# Patient Record
Sex: Male | Born: 1955 | Race: Black or African American | Hispanic: No | Smoking: Former smoker
Health system: Southern US, Community
[De-identification: ages and names within clinical notes are randomized; demographics above are authoritative.]

## PROBLEM LIST (undated history)

## (undated) DIAGNOSIS — K649 Unspecified hemorrhoids: Secondary | ICD-10-CM

## (undated) DIAGNOSIS — M7500 Adhesive capsulitis of unspecified shoulder: Secondary | ICD-10-CM

## (undated) DIAGNOSIS — G43909 Migraine, unspecified, not intractable, without status migrainosus: Secondary | ICD-10-CM

## (undated) DIAGNOSIS — D126 Benign neoplasm of colon, unspecified: Secondary | ICD-10-CM

## (undated) DIAGNOSIS — E782 Mixed hyperlipidemia: Secondary | ICD-10-CM

## (undated) DIAGNOSIS — M6580 Other synovitis and tenosynovitis, unspecified site: Secondary | ICD-10-CM

## (undated) DIAGNOSIS — E041 Nontoxic single thyroid nodule: Secondary | ICD-10-CM

## (undated) DIAGNOSIS — K602 Anal fissure, unspecified: Secondary | ICD-10-CM

## (undated) DIAGNOSIS — R7989 Other specified abnormal findings of blood chemistry: Secondary | ICD-10-CM

## (undated) DIAGNOSIS — R945 Abnormal results of liver function studies: Secondary | ICD-10-CM

## (undated) DIAGNOSIS — I10 Essential (primary) hypertension: Secondary | ICD-10-CM

## (undated) HISTORY — DX: Benign neoplasm of colon, unspecified: D12.6

## (undated) HISTORY — DX: Migraine, unspecified, not intractable, without status migrainosus: G43.909

## (undated) HISTORY — DX: Anal fissure, unspecified: K60.2

## (undated) HISTORY — PX: HEMICOLECTOMY: SHX854

## (undated) HISTORY — DX: Adhesive capsulitis of unspecified shoulder: M75.00

## (undated) HISTORY — DX: Other synovitis and tenosynovitis, unspecified site: M65.80

## (undated) HISTORY — DX: Unspecified hemorrhoids: K64.9

## (undated) HISTORY — DX: Essential (primary) hypertension: I10

## (undated) HISTORY — DX: Nontoxic single thyroid nodule: E04.1

## (undated) HISTORY — DX: Other specified abnormal findings of blood chemistry: R79.89

## (undated) HISTORY — DX: Mixed hyperlipidemia: E78.2

## (undated) HISTORY — DX: Abnormal results of liver function studies: R94.5

## (undated) HISTORY — PX: OTHER SURGICAL HISTORY: SHX169

---

## 1998-01-29 ENCOUNTER — Encounter (HOSPITAL_COMMUNITY): Admission: RE | Admit: 1998-01-29 | Discharge: 1998-04-29 | Payer: Self-pay | Admitting: Psychiatry

## 1999-02-05 ENCOUNTER — Ambulatory Visit (HOSPITAL_BASED_OUTPATIENT_CLINIC_OR_DEPARTMENT_OTHER): Admission: RE | Admit: 1999-02-05 | Discharge: 1999-02-05 | Payer: Self-pay | Admitting: Surgery

## 2010-07-01 DIAGNOSIS — D126 Benign neoplasm of colon, unspecified: Secondary | ICD-10-CM

## 2010-07-01 HISTORY — DX: Benign neoplasm of colon, unspecified: D12.6

## 2010-07-30 ENCOUNTER — Inpatient Hospital Stay (HOSPITAL_COMMUNITY): Admission: EM | Admit: 2010-07-30 | Discharge: 2010-08-10 | Payer: Self-pay | Admitting: Emergency Medicine

## 2010-07-30 ENCOUNTER — Encounter: Admission: RE | Admit: 2010-07-30 | Discharge: 2010-07-30 | Payer: Self-pay | Admitting: Gastroenterology

## 2010-07-30 ENCOUNTER — Encounter (INDEPENDENT_AMBULATORY_CARE_PROVIDER_SITE_OTHER): Payer: Self-pay

## 2011-01-13 LAB — CULTURE, BLOOD (ROUTINE X 2)
Culture  Setup Time: 201110032128
Culture: NO GROWTH

## 2011-01-13 LAB — BASIC METABOLIC PANEL
BUN: 12 mg/dL (ref 6–23)
BUN: 12 mg/dL (ref 6–23)
BUN: 15 mg/dL (ref 6–23)
BUN: 4 mg/dL — ABNORMAL LOW (ref 6–23)
BUN: 5 mg/dL — ABNORMAL LOW (ref 6–23)
CO2: 26 mEq/L (ref 19–32)
CO2: 26 mEq/L (ref 19–32)
CO2: 27 mEq/L (ref 19–32)
CO2: 28 mEq/L (ref 19–32)
CO2: 28 mEq/L (ref 19–32)
CO2: 28 mEq/L (ref 19–32)
CO2: 30 mEq/L (ref 19–32)
Calcium: 8.3 mg/dL — ABNORMAL LOW (ref 8.4–10.5)
Calcium: 8.3 mg/dL — ABNORMAL LOW (ref 8.4–10.5)
Calcium: 8.4 mg/dL (ref 8.4–10.5)
Calcium: 8.4 mg/dL (ref 8.4–10.5)
Calcium: 8.6 mg/dL (ref 8.4–10.5)
Calcium: 8.8 mg/dL (ref 8.4–10.5)
Chloride: 102 mEq/L (ref 96–112)
Chloride: 106 mEq/L (ref 96–112)
Chloride: 108 mEq/L (ref 96–112)
Chloride: 99 mEq/L (ref 96–112)
Creatinine, Ser: 1.09 mg/dL (ref 0.4–1.5)
Creatinine, Ser: 1.34 mg/dL (ref 0.4–1.5)
Creatinine, Ser: 1.4 mg/dL (ref 0.4–1.5)
Creatinine, Ser: 1.4 mg/dL (ref 0.4–1.5)
Creatinine, Ser: 1.48 mg/dL (ref 0.4–1.5)
GFR calc Af Amer: >60 mL/min (ref >60)
GFR calc Af Amer: >60 mL/min (ref >60)
GFR calc non Af Amer: 50 mL/min — ABNORMAL LOW (ref >60)
GFR calc non Af Amer: 53 mL/min — ABNORMAL LOW (ref >60)
GFR calc non Af Amer: 59 mL/min — ABNORMAL LOW (ref >60)
Glucose, Bld: 107 mg/dL — ABNORMAL HIGH (ref 70–99)
Glucose, Bld: 107 mg/dL — ABNORMAL HIGH (ref 70–99)
Glucose, Bld: 109 mg/dL — ABNORMAL HIGH (ref 70–99)
Glucose, Bld: 112 mg/dL — ABNORMAL HIGH (ref 70–99)
Glucose, Bld: 114 mg/dL — ABNORMAL HIGH (ref 70–99)
Glucose, Bld: 114 mg/dL — ABNORMAL HIGH (ref 70–99)
Glucose, Bld: 124 mg/dL — ABNORMAL HIGH (ref 70–99)
Potassium: 3.8 mEq/L (ref 3.5–5.1)
Potassium: 4.1 mEq/L (ref 3.5–5.1)
Potassium: 4.2 mEq/L (ref 3.5–5.1)
Potassium: 4.2 mEq/L (ref 3.5–5.1)
Sodium: 136 mEq/L (ref 135–145)
Sodium: 137 mEq/L (ref 135–145)
Sodium: 138 mEq/L (ref 135–145)

## 2011-01-13 LAB — WOUND CULTURE

## 2011-01-13 LAB — CBC
HCT: 32.6 % — ABNORMAL LOW (ref 39.0–52.0)
HCT: 33.1 % — ABNORMAL LOW (ref 39.0–52.0)
HCT: 33.8 % — ABNORMAL LOW (ref 39.0–52.0)
HCT: 34.8 % — ABNORMAL LOW (ref 39.0–52.0)
HCT: 38.4 % — ABNORMAL LOW (ref 39.0–52.0)
HCT: 41.6 % (ref 39.0–52.0)
Hemoglobin: 10.8 g/dL — ABNORMAL LOW (ref 13.0–17.0)
Hemoglobin: 11.2 g/dL — ABNORMAL LOW (ref 13.0–17.0)
Hemoglobin: 11.4 g/dL — ABNORMAL LOW (ref 13.0–17.0)
MCH: 30.4 pg (ref 26.0–34.0)
MCH: 30.7 pg (ref 26.0–34.0)
MCH: 30.7 pg (ref 26.0–34.0)
MCH: 30.8 pg (ref 26.0–34.0)
MCH: 31 pg (ref 26.0–34.0)
MCH: 31 pg (ref 26.0–34.0)
MCH: 31.2 pg (ref 26.0–34.0)
MCHC: 32.8 g/dL (ref 30.0–36.0)
MCHC: 33.1 g/dL (ref 30.0–36.0)
MCHC: 33.1 g/dL (ref 30.0–36.0)
MCHC: 33.3 g/dL (ref 30.0–36.0)
MCHC: 33.5 g/dL (ref 30.0–36.0)
MCHC: 33.8 g/dL (ref 30.0–36.0)
MCHC: 35.8 g/dL (ref 30.0–36.0)
MCV: 91.8 fL (ref 78.0–100.0)
MCV: 92 fL (ref 78.0–100.0)
MCV: 92.6 fL (ref 78.0–100.0)
MCV: 93 fL (ref 78.0–100.0)
Platelets: 202 10*3/uL (ref 150–400)
Platelets: 209 10*3/uL (ref 150–400)
Platelets: 231 10*3/uL (ref 150–400)
Platelets: 558 10*3/uL — ABNORMAL HIGH (ref 150–400)
Platelets: 209 10*3/uL (ref 150–400)
RBC: 3.62 MIL/uL — ABNORMAL LOW (ref 4.22–5.81)
RBC: 3.68 MIL/uL — ABNORMAL LOW (ref 4.22–5.81)
RBC: 3.7 MIL/uL — ABNORMAL LOW (ref 4.22–5.81)
RBC: 4.13 MIL/uL — ABNORMAL LOW (ref 4.22–5.81)
RDW: 14.5 % (ref 11.5–15.5)
RDW: 14.6 % (ref 11.5–15.5)
RDW: 14.7 % (ref 11.5–15.5)
RDW: 14.9 % (ref 11.5–15.5)
RDW: 15 % (ref 11.5–15.5)
RDW: 14.7 % (ref 11.5–15.5)
WBC: 9.3 10*3/uL (ref 4.0–10.5)
WBC: 11.9 10*3/uL — ABNORMAL HIGH (ref 4.0–10.5)

## 2011-01-13 LAB — MAGNESIUM: Magnesium: 1.9 mg/dL (ref 1.5–2.5)

## 2011-01-13 LAB — COMPREHENSIVE METABOLIC PANEL
ALT: 35 U/L (ref 0–53)
Albumin: 3.9 g/dL (ref 3.5–5.2)
Alkaline Phosphatase: 56 U/L (ref 39–117)
BUN: 11 mg/dL (ref 6–23)
Calcium: 9.3 mg/dL (ref 8.4–10.5)
Glucose, Bld: 95 mg/dL (ref 70–99)
Potassium: 3.3 mEq/L — ABNORMAL LOW (ref 3.5–5.1)
Sodium: 138 mEq/L (ref 135–145)
Total Protein: 7 g/dL (ref 6.0–8.3)

## 2011-01-13 LAB — PROTIME-INR
INR: 1.13 (ref 0.00–1.49)
Prothrombin Time: 14.7 seconds (ref 11.6–15.2)

## 2011-01-13 LAB — MRSA PCR SCREENING: MRSA by PCR: NEGATIVE

## 2011-01-13 LAB — PHOSPHORUS
Phosphorus: 1.5 mg/dL — ABNORMAL LOW (ref 2.3–4.6)
Phosphorus: 3.1 mg/dL (ref 2.3–4.6)

## 2011-02-24 ENCOUNTER — Other Ambulatory Visit: Payer: Self-pay | Admitting: General Surgery

## 2011-02-25 ENCOUNTER — Ambulatory Visit
Admission: RE | Admit: 2011-02-25 | Discharge: 2011-02-25 | Disposition: A | Discharge status: Home / Self Care | Payer: 59 | Source: Ambulatory Visit | Attending: General Surgery | Admitting: General Surgery

## 2011-02-25 MED ORDER — IOHEXOL 300 MG/ML  SOLN
100.0000 mL | Freq: Once | INTRAMUSCULAR | Status: AC | PRN
  Administered 2011-02-25: 100 mL via INTRAVENOUS

## 2011-02-28 ENCOUNTER — Other Ambulatory Visit: Payer: Self-pay

## 2013-12-20 ENCOUNTER — Other Ambulatory Visit: Payer: Self-pay | Admitting: Family Medicine

## 2013-12-20 DIAGNOSIS — R269 Unspecified abnormalities of gait and mobility: Secondary | ICD-10-CM

## 2013-12-20 DIAGNOSIS — R531 Weakness: Secondary | ICD-10-CM

## 2013-12-21 ENCOUNTER — Ambulatory Visit
Admission: RE | Admit: 2013-12-21 | Discharge: 2013-12-21 | Disposition: A | Discharge status: Home / Self Care | Payer: 59 | Source: Ambulatory Visit | Attending: Family Medicine | Admitting: Family Medicine

## 2013-12-21 DIAGNOSIS — R531 Weakness: Secondary | ICD-10-CM

## 2013-12-21 DIAGNOSIS — R269 Unspecified abnormalities of gait and mobility: Secondary | ICD-10-CM

## 2014-01-01 ENCOUNTER — Other Ambulatory Visit: Payer: Self-pay | Admitting: Family Medicine

## 2014-01-01 DIAGNOSIS — G459 Transient cerebral ischemic attack, unspecified: Secondary | ICD-10-CM

## 2014-01-03 ENCOUNTER — Other Ambulatory Visit: Payer: 59

## 2014-01-06 ENCOUNTER — Ambulatory Visit
Admission: RE | Admit: 2014-01-06 | Discharge: 2014-01-06 | Disposition: A | Discharge status: Home / Self Care | Payer: 59 | Source: Ambulatory Visit | Attending: Family Medicine | Admitting: Family Medicine

## 2014-01-06 DIAGNOSIS — G459 Transient cerebral ischemic attack, unspecified: Secondary | ICD-10-CM

## 2014-01-21 ENCOUNTER — Ambulatory Visit: Payer: 59 | Admitting: Neurology

## 2014-02-11 ENCOUNTER — Ambulatory Visit: Payer: 59 | Admitting: Neurology

## 2014-05-22 ENCOUNTER — Encounter: Payer: Self-pay | Admitting: Cardiology

## 2014-05-22 DIAGNOSIS — E782 Mixed hyperlipidemia: Secondary | ICD-10-CM | POA: Insufficient documentation

## 2014-05-22 DIAGNOSIS — E041 Nontoxic single thyroid nodule: Secondary | ICD-10-CM | POA: Insufficient documentation

## 2014-05-22 DIAGNOSIS — I10 Essential (primary) hypertension: Secondary | ICD-10-CM | POA: Insufficient documentation

## 2014-05-22 DIAGNOSIS — G43909 Migraine, unspecified, not intractable, without status migrainosus: Secondary | ICD-10-CM | POA: Insufficient documentation

## 2014-05-28 ENCOUNTER — Ambulatory Visit: Payer: 59 | Admitting: Interventional Cardiology

## 2014-10-03 DIAGNOSIS — Z0000 Encounter for general adult medical examination without abnormal findings: Secondary | ICD-10-CM | POA: Insufficient documentation

## 2015-02-24 ENCOUNTER — Encounter: Payer: Self-pay | Admitting: Internal Medicine

## 2015-03-26 ENCOUNTER — Encounter: Payer: Self-pay | Admitting: *Deleted

## 2015-04-29 ENCOUNTER — Ambulatory Visit (INDEPENDENT_AMBULATORY_CARE_PROVIDER_SITE_OTHER): Payer: BLUE CROSS/BLUE SHIELD | Admitting: Internal Medicine

## 2015-04-29 ENCOUNTER — Other Ambulatory Visit (INDEPENDENT_AMBULATORY_CARE_PROVIDER_SITE_OTHER): Payer: BLUE CROSS/BLUE SHIELD

## 2015-04-29 ENCOUNTER — Encounter: Payer: Self-pay | Admitting: Internal Medicine

## 2015-04-29 VITALS — BP 130/86 | HR 72 | Ht 66.14 in | Wt 230.2 lb

## 2015-04-29 DIAGNOSIS — Z860101 Personal history of adenomatous and serrated colon polyps: Secondary | ICD-10-CM

## 2015-04-29 DIAGNOSIS — R945 Abnormal results of liver function studies: Principal | ICD-10-CM

## 2015-04-29 DIAGNOSIS — Z86010 Personal history of colonic polyps: Secondary | ICD-10-CM

## 2015-04-29 DIAGNOSIS — R7989 Other specified abnormal findings of blood chemistry: Secondary | ICD-10-CM | POA: Diagnosis not present

## 2015-04-29 LAB — HEPATIC FUNCTION PANEL
ALBUMIN: 4.3 g/dL (ref 3.5–5.2)
ALT: 163 U/L — AB (ref 0–53)
AST: 85 U/L — AB (ref 0–37)
Alkaline Phosphatase: 60 U/L (ref 39–117)
Bilirubin, Direct: 0.2 mg/dL (ref 0.0–0.3)
Total Bilirubin: 0.7 mg/dL (ref 0.2–1.2)
Total Protein: 7.4 g/dL (ref 6.0–8.3)

## 2015-04-29 LAB — CBC WITH DIFFERENTIAL/PLATELET
Basophils Absolute: 0 10*3/uL (ref 0.0–0.1)
Basophils Relative: 0.5 % (ref 0.0–3.0)
EOS ABS: 0.2 10*3/uL (ref 0.0–0.7)
Eosinophils Relative: 3.3 % (ref 0.0–5.0)
HEMATOCRIT: 43.8 % (ref 39.0–52.0)
HEMOGLOBIN: 14.9 g/dL (ref 13.0–17.0)
LYMPHS PCT: 34 % (ref 12.0–46.0)
Lymphs Abs: 1.6 10*3/uL (ref 0.7–4.0)
MCHC: 34 g/dL (ref 30.0–36.0)
MCV: 91.3 fl (ref 78.0–100.0)
Monocytes Absolute: 0.7 10*3/uL (ref 0.1–1.0)
Monocytes Relative: 14.8 % — ABNORMAL HIGH (ref 3.0–12.0)
NEUTROS PCT: 47.4 % (ref 43.0–77.0)
Neutro Abs: 2.2 10*3/uL (ref 1.4–7.7)
Platelets: 193 10*3/uL (ref 150.0–400.0)
RBC: 4.79 Mil/uL (ref 4.22–5.81)
RDW: 14.1 % (ref 11.5–15.5)
WBC: 4.6 10*3/uL (ref 4.0–10.5)

## 2015-04-29 LAB — PROTIME-INR
INR: 1 ratio (ref 0.8–1.0)
Prothrombin Time: 10.7 s (ref 9.6–13.1)

## 2015-04-29 NOTE — Patient Instructions (Signed)
Your physician has requested that you go to the basement for lab work before leaving today.  We will contact you once we get additional medical records.

## 2015-04-30 ENCOUNTER — Encounter: Payer: Self-pay | Admitting: Internal Medicine

## 2015-04-30 LAB — IGG: IgG (Immunoglobin G), Serum: 1340 mg/dL (ref 650–1600)

## 2015-04-30 LAB — ANA: ANA: NEGATIVE

## 2015-04-30 NOTE — Progress Notes (Signed)
Patient ID: William Weaver, male   DOB: 02-15-1956, 59 y.o.   MRN: [id number] HPI: William Weaver is 59 yo male with PMH of adenomatous colon polyps at colonoscopy in 9622 complicated by right colon perforation requiring right hemicolectomy, hypertension, hyperlipidemia who is seen in consultation at the request of Dr. Moreen Fowler to evaluate elevated liver enzymes.  He is here alone today. He reports he is feeling well. He's had elevated liver enzymes over the last approximate year. Denies abdominal symptoms entirely. Reports he feels well with good energy. He works out 2-4 days a week lifting weights and doing cardio usually on an elliptical machine. He denies abdominal pain. No history of ascites or lower extremity edema. No history of jaundice. No itching. No nausea, vomiting, early satiety, dysphagia or odynophagia. Good appetite. No heartburn. No weight loss. No change in bowel habits including no rectal bleeding or melena. He does have a history of hemorrhoids. He also has a history of migraine headaches and was started on Depakote roughly 1 year ago. He takes amlodipine, clonidine, labetalol, and valsartan for hypertension.  He does drink alcohol approximately one glass 1 per day and occasional liquor on the weekends usually no more than 2 drinks in any day.  He is nervous about any further colonoscopy given perforation after polypectomy at initial screening exam. He reports he feels like this procedure was "botched"  Family history notable for heart disease. No family history of GI malignancy or liver disease.  Past Medical History  Diagnosis Date  . Essential hypertension, benign   . Mixed hyperlipidemia   . Nontoxic uninodular goiter   . Migraine, unspecified, without mention of intractable migraine without mention of status migrainosus     Dr. Ginny Forth [facility]  . Hemorrhoid     fistula  . Calcium deposits in tendon     KNEE  . Frozen shoulder   . Elevated LFTs   . Tubular adenoma of colon  07/2010    MULTIPLE  . Anal fissure     Past Surgical History  Procedure Laterality Date  . Diagnostic lapaoscopy      hernia repair  . Hemicolectomy      with ileocolonic anastomosis    Outpatient Prescriptions Prior to Visit  Medication Sig Dispense Refill  . amLODipine (NORVASC) 10 MG tablet Take 10 mg by mouth daily.    Marland Kitchen aspirin EC 81 MG tablet Take 243 mg by mouth daily.     . cloNIDine (CATAPRES) 0.2 MG tablet Take 0.2 mg by mouth 2 (two) times daily.     . Multiple Vitamins tablet Take 1 tablet by mouth daily.     . valsartan (DIOVAN) 320 MG tablet Take 320 mg by mouth daily.    . vardenafil (LEVITRA) 20 MG tablet Take 20 mg by mouth daily as needed for erectile dysfunction.    . carvedilol (COREG) 6.25 MG tablet Take 6.25 mg by mouth 2 (two) times daily with a meal. Only takes when goes to gym    . acetaminophen (TYLENOL) 325 MG tablet Take 325 mg by mouth every 4 (four) hours as needed.     . nortriptyline (PAMELOR) 25 MG capsule Take 25 mg by mouth at bedtime.    . Omega-3 1000 MG CAPS Take 1 g by mouth daily.      No facility-administered medications prior to visit.    Allergies  Allergen Reactions  . Penicillin G     Other reaction(s): Tremor (intolerance)    Family History  Problem  Relation Age of Onset  . Hypotension Mother   . Hypertension Father   . Hypertension Sister   . Heart attack Maternal Grandmother   . Heart attack Maternal Grandfather   . Heart attack Paternal Grandmother   . Heart attack Paternal Grandfather     History  Substance Use Topics  . Smoking status: Former Smoker    Types: Cigarettes    Quit date: 10/31/2009  . Smokeless tobacco: Never Used  . Alcohol Use: 4.2 oz/week    7 Standard drinks or equivalent per week     Comment: glass of wine a day    ROS: As per history of present illness, otherwise negative  BP 130/86 mmHg  Pulse 72  Ht 5' 6.14" (1.68 m)  Wt 230 lb 4 oz (104.441 kg)  BMI 37.00 kg/m2 Constitutional:  Well-developed and well-nourished. No distress. HEENT: Normocephalic and atraumatic. Oropharynx is clear and moist. No oropharyngeal exudate. Conjunctivae are normal.  No scleral icterus. Neck: Neck supple. Trachea midline. Cardiovascular: Normal rate, regular rhythm and intact distal pulses. No M/R/G Pulmonary/chest: Effort normal and breath sounds normal. No wheezing, rales or rhonchi. Abdominal: Soft, nontender, nondistended. Bowel sounds active throughout. There are no masses palpable. No hepatosplenomegaly. Extremities: no clubbing, cyanosis, or edema Lymphadenopathy: No cervical adenopathy noted. Neurological: Alert and oriented to person place and time. Skin: Skin is warm and dry. No rashes noted. Psychiatric: Normal mood and affect. Behavior is normal.  RELEVANT LABS AND IMAGING: CBC    Component Value Date/Time   WBC 4.6 04/29/2015 1647   RBC 4.79 04/29/2015 1647   HGB 14.9 04/29/2015 1647   HCT 43.8 04/29/2015 1647   PLT 193.0 04/29/2015 1647   MCV 91.3 04/29/2015 1647   MCH 30.7 08/09/2010 0600   MCHC 34.0 04/29/2015 1647   RDW 14.1 04/29/2015 1647   LYMPHSABS 1.6 04/29/2015 1647   MONOABS 0.7 04/29/2015 1647   EOSABS 0.2 04/29/2015 1647   BASOSABS 0.0 04/29/2015 1647    CMP     Component Value Date/Time   NA 141 08/09/2010 0600   K 4.1 08/09/2010 0600   CL 108 08/09/2010 0600   CO2 27 08/09/2010 0600   GLUCOSE 114* 08/09/2010 0600   BUN 4* 08/09/2010 0600   CREATININE 1.09 08/09/2010 0600   CALCIUM 8.8 08/09/2010 0600   PROT 7.4 04/29/2015 1647   ALBUMIN 4.3 04/29/2015 1647   AST 85* 04/29/2015 1647   ALT 163* 04/29/2015 1647   ALKPHOS 60 04/29/2015 1647   BILITOT 0.7 04/29/2015 1647   GFRNONAA >60 08/09/2010 0600   GFRAA  08/09/2010 0600    >60        The eGFR has been calculated using the MDRD equation. This calculation has not been validated in all clinical situations. eGFR's persistently <60 mL/min signify possible Chronic Kidney Disease.    Viral hep panel - neg Iron sat 25%  Abdominal ultrasound performed by primary care recently -- reportedly normal. Formal report not yet obtained   Colonoscopy from 07/29/2010 -- Dr. Amedeo Plenty, report not available pathology report reveals cecal tubular adenoma; cecal polyp #2 tubular adenoma; transverse polyp tubular adenoma; descending polyp tubular adenoma; rectal polyp tubular adenoma --Surgical report from Dr. Greer Pickerel the next day for cecal perforation reviewed -- open right hemicolectomy with ileocolonic anastomosis  ASSESSMENT/PLAN:  59 yo male with PMH of adenomatous colon polyps at colonoscopy in 1610 complicated by right colon perforation requiring right hemicolectomy, hypertension, hyperlipidemia who is seen in consultation at the request of Dr.  Swayne to evaluate elevated liver enzymes.   1. Elevated LFTs -- elevation in serum AST and ALT, etiology unclear. I would like to review the ultrasound report, images of possible. I would like to exclude fatty liver disease as a possible source of transaminitis. If no steatosis seen by ultrasound than medication induced transaminitis is possible. Depakote is certainly possible and he has been on this medication about a year which is about as long as his enzymes have been elevated. I would like to perform other lab testing today to ensure no other causes of liver inflammation, specifically autoimmune conditions. Multiple liver labs checked today including ANA, IgG, AMA, ASMA, INR, alpha-1, ceruloplasmin. If other testing unrevealing, then will last the patient to stop Depakote and repeat enzymes in one month  2. History of adenomatous colon polyps -- after having perforation with first colonoscopy he is very concerned about the possibility of having another colonoscopy. Given multiple adenomatous polyps he is due for repeat screening at this time. We had a long discussion today regarding other screening modalities including barium enema and virtual  colonoscopy, but that if either of these tests are positive colonoscopy would be recommended. I let him know that I recommend he be okay with colonoscopy if we order any other screening tests. I would like to review the colonoscopy report from Dr. Amedeo Plenty and then we can further discuss how to repeat screening. He will also research and speak with his family about the other screening modalities we discussed.    Cc: Dr. Antony Contras, MD

## 2015-05-01 LAB — ANTI-SMOOTH MUSCLE ANTIBODY, IGG: Smooth Muscle Ab: 8 U (ref <20)

## 2015-05-01 LAB — ALPHA-1-ANTITRYPSIN: A1 ANTITRYPSIN SER: 118 mg/dL (ref 83–199)

## 2015-05-01 LAB — CERULOPLASMIN: CERULOPLASMIN: 22 mg/dL (ref 18–36)

## 2015-05-05 LAB — MITOCHONDRIAL ANTIBODIES: Mitochondrial M2 Ab, IgG: 1.19 — ABNORMAL HIGH (ref <0.91)

## 2015-05-06 ENCOUNTER — Other Ambulatory Visit: Payer: Self-pay

## 2015-05-06 DIAGNOSIS — R7989 Other specified abnormal findings of blood chemistry: Secondary | ICD-10-CM

## 2015-05-06 DIAGNOSIS — R945 Abnormal results of liver function studies: Principal | ICD-10-CM

## 2015-05-22 ENCOUNTER — Telehealth: Payer: Self-pay

## 2015-05-22 NOTE — Telephone Encounter (Signed)
Dr. Hilarie Fredrickson has reviewed previous Colonoscopy records and patient has had multiple adenomas, including high-risk lesions based on size. His recommendations are for patient to have a repeat Colonoscopy at this time.  Left a message for patient to return my call.

## 2015-05-24 ENCOUNTER — Emergency Department (HOSPITAL_COMMUNITY)
Admission: EM | Admit: 2015-05-24 | Discharge: 2015-05-24 | Disposition: A | Discharge status: Home / Self Care | Payer: BLUE CROSS/BLUE SHIELD | Attending: Emergency Medicine | Admitting: Emergency Medicine

## 2015-05-24 ENCOUNTER — Emergency Department (HOSPITAL_COMMUNITY): Payer: BLUE CROSS/BLUE SHIELD

## 2015-05-24 ENCOUNTER — Encounter (HOSPITAL_COMMUNITY): Payer: Self-pay | Admitting: Emergency Medicine

## 2015-05-24 DIAGNOSIS — G43909 Migraine, unspecified, not intractable, without status migrainosus: Secondary | ICD-10-CM | POA: Insufficient documentation

## 2015-05-24 DIAGNOSIS — Z880 Allergy status to penicillin: Secondary | ICD-10-CM | POA: Insufficient documentation

## 2015-05-24 DIAGNOSIS — S0512XA Contusion of eyeball and orbital tissues, left eye, initial encounter: Secondary | ICD-10-CM

## 2015-05-24 DIAGNOSIS — Z7982 Long term (current) use of aspirin: Secondary | ICD-10-CM | POA: Diagnosis not present

## 2015-05-24 DIAGNOSIS — W109XXA Fall (on) (from) unspecified stairs and steps, initial encounter: Secondary | ICD-10-CM | POA: Insufficient documentation

## 2015-05-24 DIAGNOSIS — K0381 Cracked tooth: Secondary | ICD-10-CM | POA: Insufficient documentation

## 2015-05-24 DIAGNOSIS — Z8639 Personal history of other endocrine, nutritional and metabolic disease: Secondary | ICD-10-CM | POA: Diagnosis not present

## 2015-05-24 DIAGNOSIS — I10 Essential (primary) hypertension: Secondary | ICD-10-CM | POA: Insufficient documentation

## 2015-05-24 DIAGNOSIS — Y9389 Activity, other specified: Secondary | ICD-10-CM | POA: Insufficient documentation

## 2015-05-24 DIAGNOSIS — Y929 Unspecified place or not applicable: Secondary | ICD-10-CM | POA: Diagnosis not present

## 2015-05-24 DIAGNOSIS — Z86018 Personal history of other benign neoplasm: Secondary | ICD-10-CM | POA: Insufficient documentation

## 2015-05-24 DIAGNOSIS — Y999 Unspecified external cause status: Secondary | ICD-10-CM | POA: Insufficient documentation

## 2015-05-24 DIAGNOSIS — Z87891 Personal history of nicotine dependence: Secondary | ICD-10-CM | POA: Insufficient documentation

## 2015-05-24 DIAGNOSIS — S01511A Laceration without foreign body of lip, initial encounter: Secondary | ICD-10-CM

## 2015-05-24 DIAGNOSIS — S0993XA Unspecified injury of face, initial encounter: Secondary | ICD-10-CM | POA: Diagnosis present

## 2015-05-24 DIAGNOSIS — Z79899 Other long term (current) drug therapy: Secondary | ICD-10-CM | POA: Insufficient documentation

## 2015-05-24 DIAGNOSIS — Z23 Encounter for immunization: Secondary | ICD-10-CM | POA: Diagnosis not present

## 2015-05-24 DIAGNOSIS — K088 Other specified disorders of teeth and supporting structures: Secondary | ICD-10-CM | POA: Diagnosis not present

## 2015-05-24 MED ORDER — LIDOCAINE HCL (PF) 1 % IJ SOLN
5.0000 mL | Freq: Once | INTRAMUSCULAR | Status: AC
  Administered 2015-05-24: 5 mL
  Filled 2015-05-24: qty 5

## 2015-05-24 MED ORDER — LIDOCAINE HCL (PF) 1 % IJ SOLN
INTRAMUSCULAR | Status: AC
  Filled 2015-05-24: qty 5

## 2015-05-24 MED ORDER — IBUPROFEN 400 MG PO TABS
600.0000 mg | ORAL_TABLET | Freq: Once | ORAL | Status: AC
  Administered 2015-05-24: 600 mg via ORAL
  Filled 2015-05-24 (×2): qty 1

## 2015-05-24 MED ORDER — LIDOCAINE HCL (PF) 1 % IJ SOLN: 5.0000 mL | Freq: Once | INTRAMUSCULAR | Status: DC

## 2015-05-24 MED ORDER — TETANUS-DIPHTH-ACELL PERTUSSIS 5-2.5-18.5 LF-MCG/0.5 IM SUSP
0.5000 mL | Freq: Once | INTRAMUSCULAR | Status: AC
  Administered 2015-05-24: 0.5 mL via INTRAMUSCULAR
  Filled 2015-05-24: qty 0.5

## 2015-05-24 MED ORDER — LIDOCAINE HCL (PF) 1 % IJ SOLN
5.0000 mL | Freq: Once | INTRAMUSCULAR | Status: AC
  Administered 2015-05-24: 5 mL via INTRADERMAL
  Filled 2015-05-24: qty 5

## 2015-05-24 NOTE — ED Notes (Signed)
MD at bedside. 

## 2015-05-24 NOTE — ED Provider Notes (Signed)
CSN: [id number]     Arrival date & time 05/24/15  1141 History   First MD Initiated Contact with Patient 05/24/15 1156     Chief Complaint  Patient presents with  . Fall  . Lip Laceration     (Consider location/radiation/quality/duration/timing/severity/associated sxs/prior Treatment) Patient is a 59 y.o. male presenting with skin laceration.  Laceration Location:  Head/neck Head/neck laceration location: chin, lower lip. Length (cm):  3 Laceration depth: through and through. Quality: jagged and stellate   Bleeding: controlled   Time since incident:  12 hours Laceration mechanism:  Fall Pain details:    Severity:  Mild   Progression:  Unchanged Tetanus status:  Out of date   Past Medical History  Diagnosis Date  . Essential hypertension, benign   . Mixed hyperlipidemia   . Nontoxic uninodular goiter   . Migraine, unspecified, without mention of intractable migraine without mention of status migrainosus     Dr. Ginny Forth [facility]  . Hemorrhoid     fistula  . Calcium deposits in tendon     KNEE  . Frozen shoulder   . Elevated LFTs   . Tubular adenoma of colon 07/2010    MULTIPLE  . Anal fissure    Past Surgical History  Procedure Laterality Date  . Diagnostic lapaoscopy      hernia repair  . Hemicolectomy      with ileocolonic anastomosis   Family History  Problem Relation Age of Onset  . Hypotension Mother   . Hypertension Father   . Hypertension Sister   . Heart attack Maternal Grandmother   . Heart attack Maternal Grandfather   . Heart attack Paternal Grandmother   . Heart attack Paternal Grandfather    History  Substance Use Topics  . Smoking status: Former Smoker    Types: Cigarettes    Quit date: 10/31/2009  . Smokeless tobacco: Never Used  . Alcohol Use: 4.2 oz/week    7 Standard drinks or equivalent per week     Comment: glass of wine a day    Review of Systems  Constitutional: Negative for fever.  HENT: Positive for dental problem (broken  front tooth) and facial swelling (swelling left lip). Negative for sore throat.   Eyes: Negative for visual disturbance.  Respiratory: Negative for shortness of breath.   Cardiovascular: Negative for chest pain.  Gastrointestinal: Negative for nausea, vomiting and abdominal pain.  Genitourinary: Negative for difficulty urinating.  Musculoskeletal: Negative for back pain and neck stiffness.  Skin: Positive for wound (chin/lip). Negative for rash.  Neurological: Negative for syncope, facial asymmetry, speech difficulty, weakness, numbness and headaches.      Allergies  Penicillin g  Home Medications   Prior to Admission medications   Medication Sig Start Date End Date Taking? Authorizing Provider  amLODipine (NORVASC) 10 MG tablet Take 10 mg by mouth daily.    Historical Provider, MD  aspirin EC 81 MG tablet Take 243 mg by mouth daily.     Historical Provider, MD  cloNIDine (CATAPRES) 0.2 MG tablet Take 0.2 mg by mouth 2 (two) times daily.     Historical Provider, MD  divalproex (DEPAKOTE ER) 500 MG 24 hr tablet Take 500 mg by mouth daily.    Historical Provider, MD  labetalol (NORMODYNE) 200 MG tablet Take 200 mg by mouth. Two-three times a day    Historical Provider, MD  Multiple Vitamins tablet Take 1 tablet by mouth daily.     Historical Provider, MD  valsartan (DIOVAN) 320 MG tablet  Take 320 mg by mouth daily.    Historical Provider, MD  vardenafil (LEVITRA) 20 MG tablet Take 20 mg by mouth daily as needed for erectile dysfunction.    Historical Provider, MD   BP 157/102 mmHg  Pulse 75  Temp(Src) 98.3 F (36.8 C) (Oral)  Resp 16  Ht 5\' 8"  (1.727 m)  Wt 228 lb (103.42 kg)  BMI 34.68 kg/m2  SpO2 97% Physical Exam  Constitutional: He is oriented to person, place, and time. He appears well-developed and well-nourished. No distress.  HENT:  Head: Normocephalic. Head is with contusion and with laceration (chin just inferior to vermillion border).  Nose: No nasal deformity,  septal deviation or nasal septal hematoma.  Mouth/Throat: No oropharyngeal exudate.  +3cm abrasion/laceration to inside of bottom lip  pinpoing area through and through to 1cm laceration inferior to vermillion border on outside  Eyes: Conjunctivae and EOM are normal. Pupils are equal, round, and reactive to light.  Neck: Normal range of motion.  Cardiovascular: Normal rate, regular rhythm, normal heart sounds and intact distal pulses.  Exam reveals no gallop and no friction rub.   No murmur heard. Pulmonary/Chest: Effort normal and breath sounds normal. No respiratory distress. He has no wheezes. He has no rales.  Abdominal: Soft. He exhibits no distension. There is no tenderness. There is no guarding.  Musculoskeletal: He exhibits no edema.  Neurological: He is alert and oriented to person, place, and time.  Skin: Skin is warm and dry. He is not diaphoretic.  Nursing note and vitals reviewed.   ED Course  LACERATION REPAIR Date/Time: 05/24/2015 7:35 PM Performed by: Gareth Morgan Authorized by: Gareth Morgan Consent: Verbal consent obtained. Risks and benefits: risks, benefits and alternatives were discussed Consent given by: patient Required items: required blood products, implants, devices, and special equipment available Patient identity confirmed: verbally with patient Time out: Immediately prior to procedure a "time out" was called to verify the correct patient, procedure, equipment, support staff and site/side marked as required. Body area: mouth Location details: lower lip, interior Laceration length: 3 (3cm inner lip, 2cm outer lip) cm Foreign bodies: no foreign bodies Tendon involvement: none Nerve involvement: none Vascular damage: no Anesthesia: local infiltration Local anesthetic: lidocaine 1% without epinephrine Anesthetic total: 10 ml Patient sedated: no Preparation: Patient was prepped and draped in the usual sterile fashion. Irrigation solution:  saline Irrigation method: syringe Amount of cleaning: standard Debridement: none Skin closure: 6-0 Prolene (7sutures) Subcutaneous closure: 4-0 Vicryl Number of sutures: 7 prolene, 6 deep/mucosal. Technique: complex Approximation: loose Approximation difficulty: complex Dressing: antibiotic ointment Patient tolerance: Patient tolerated the procedure well with no immediate complications Comments: Significant swelling with inability to approximate superficial layer of inner lip, deep layers and areas of superficial layer closed   (including critical care time) Labs Review Labs Reviewed - No data to display  Imaging Review No results found.   EKG Interpretation None      MDM   Final diagnoses:  None    59 year old male with history of hypercholesterolemia, hypertension presents with concern of fall last night while carrying boxes up the stairs.  Has area of pain to left orbit and laceration through bottom lip.  Patient denies any other areas of pain or tenderness.  Patient without any midline tenderness, no neurologic deficits, no distracting injuries, no intoxication and have low suspicion for cervical spine injury by Nexus criteria.  CT Face done to evaluate for facial fracture or FB and negative. Pt without LOC, no headache, no  nausea vomiting, no neurologic deficits, not on blood thinners and have low suspicion for intracranial bleed.  He was given a tetanus booster. Wound was thoroughly irrigated. It was loosely with vicryl for inside of lip laceration, and 7 6.0 prolene sutures for skin closure.  It does not cross the vermillion border. Patient discharged in stable condition with understanding of reasons to return. Skin sutures to be removed in 5 days. Patient discharged in stable condition with understanding of reasons to return.          Gareth Morgan, MD 05/24/15 [phone]

## 2015-05-24 NOTE — ED Notes (Signed)
Pt from home for eval of fall upward 2 flights of stairs while carrying some packages. Pt states he hit his head on the stairs and his tooth went through his bottom lip, pt went to [facility] and was transferred to ED for possible stitching to face. Pt denies any LOC, neck or back pain, and also denies taking any blood thinners.

## 2015-05-24 NOTE — ED Notes (Signed)
Patient transported to CT 

## 2015-05-24 NOTE — ED Notes (Signed)
MD at bedside placing suture.

## 2015-05-26 NOTE — Telephone Encounter (Signed)
Patient called and left a message with our [facility] staff that he can only get a phone call bewtween 12-1 or after 5pm at [phone]. I will not be available between 12-1 today and will not call patient after 5 pm. Will attempt to call patient another time.

## 2015-05-26 NOTE — Telephone Encounter (Signed)
Pt returned my call. Informed patient of Dr. Vena Rua recommendations. He states he is still thinking about he wants to do as far as scheduling. He might go get another opinion. He would like me to mail him the copy of his report and Dr. Vena Rua recommendations. Mailed pt a copy of his report. Told him to please call us when he makes a decision and told him it is very important he have a repeat Colonoscopy since he is at a higher risk. Pt verbalized understanding.

## 2015-06-30 ENCOUNTER — Telehealth: Payer: Self-pay

## 2015-06-30 NOTE — Telephone Encounter (Signed)
-----   Message from Algernon Huxley, RN sent at 05/06/2015  3:34 PM EDT ----- Regarding: LFT's Pt needs LFT's in 6 weeks, order in epic.

## 2015-06-30 NOTE — Telephone Encounter (Signed)
Left message for pt to call back  °

## 2015-07-01 NOTE — Telephone Encounter (Signed)
Left message to call back  

## 2015-07-02 NOTE — Telephone Encounter (Signed)
Left message for pt to call back.  After multiple attempts have been unable to reach pt. Letter mailed to pt. 

## 2015-09-22 ENCOUNTER — Other Ambulatory Visit (INDEPENDENT_AMBULATORY_CARE_PROVIDER_SITE_OTHER): Payer: BLUE CROSS/BLUE SHIELD

## 2015-09-22 DIAGNOSIS — R7989 Other specified abnormal findings of blood chemistry: Secondary | ICD-10-CM

## 2015-09-22 DIAGNOSIS — R945 Abnormal results of liver function studies: Principal | ICD-10-CM

## 2015-09-22 LAB — HEPATIC FUNCTION PANEL
ALBUMIN: 4.4 g/dL (ref 3.5–5.2)
ALT: 169 U/L — AB (ref 0–53)
AST: 77 U/L — ABNORMAL HIGH (ref 0–37)
Alkaline Phosphatase: 78 U/L (ref 39–117)
BILIRUBIN DIRECT: 0.1 mg/dL (ref 0.0–0.3)
TOTAL PROTEIN: 7.6 g/dL (ref 6.0–8.3)
Total Bilirubin: 0.6 mg/dL (ref 0.2–1.2)

## 2015-10-06 ENCOUNTER — Other Ambulatory Visit: Payer: Self-pay

## 2015-10-06 DIAGNOSIS — R7989 Other specified abnormal findings of blood chemistry: Secondary | ICD-10-CM

## 2015-10-06 DIAGNOSIS — R945 Abnormal results of liver function studies: Principal | ICD-10-CM

## 2015-10-07 ENCOUNTER — Telehealth: Payer: Self-pay | Admitting: Internal Medicine

## 2015-10-07 NOTE — Telephone Encounter (Signed)
Previous US was of the carotid duplex. Pt still needs to keep appt that is scheduled, he is aware.

## 2015-10-09 ENCOUNTER — Ambulatory Visit (HOSPITAL_COMMUNITY)
Admission: RE | Admit: 2015-10-09 | Discharge: 2015-10-09 | Disposition: A | Discharge status: Home / Self Care | Payer: BLUE CROSS/BLUE SHIELD | Source: Ambulatory Visit | Attending: Internal Medicine | Admitting: Internal Medicine

## 2015-10-09 DIAGNOSIS — R7989 Other specified abnormal findings of blood chemistry: Secondary | ICD-10-CM | POA: Insufficient documentation

## 2015-10-09 DIAGNOSIS — R945 Abnormal results of liver function studies: Secondary | ICD-10-CM

## 2015-12-08 ENCOUNTER — Ambulatory Visit: Payer: BLUE CROSS/BLUE SHIELD | Admitting: Internal Medicine

## 2016-08-22 DIAGNOSIS — R748 Abnormal levels of other serum enzymes: Secondary | ICD-10-CM | POA: Insufficient documentation

## 2016-10-02 IMAGING — CT CT MAXILLOFACIAL W/O CM
3 series · 16 of 47 positions shown, 19 images · non-contrast
Comparison: None.

CLINICAL DATA: Status post fall. Left eye pain. Left left
laceration.

EXAM:
CT MAXILLOFACIAL WITHOUT CONTRAST
TECHNIQUE: Multidetector CT imaging of the maxillofacial structures was
performed. Multiplanar CT image reconstructions were also generated.
A small metallic BB was placed on the right temple in order to
reliably differentiate right from left.

[Series 2: facial/ orbits 2.0 h30s · axial · 0.39mm/px · z∈[-232,-60]mm · 10 of 100 slices shown, 13 images]
[im 7/100  brain]
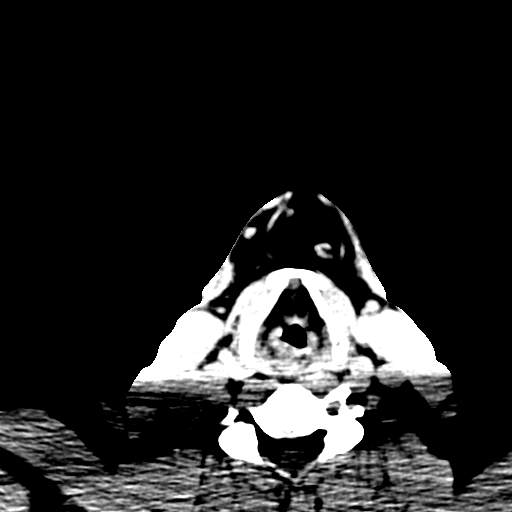
[im 7/100  bone]
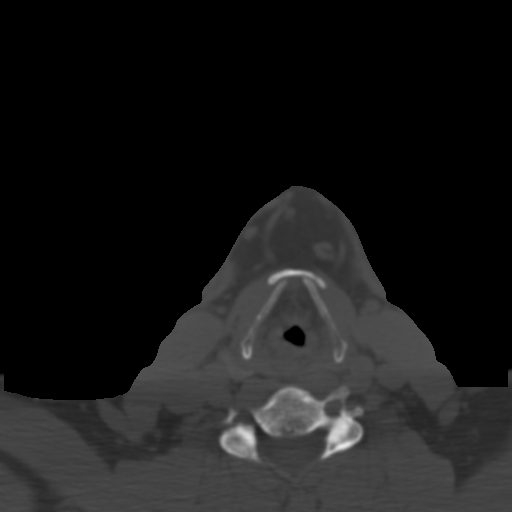
[im 18/100  bone]
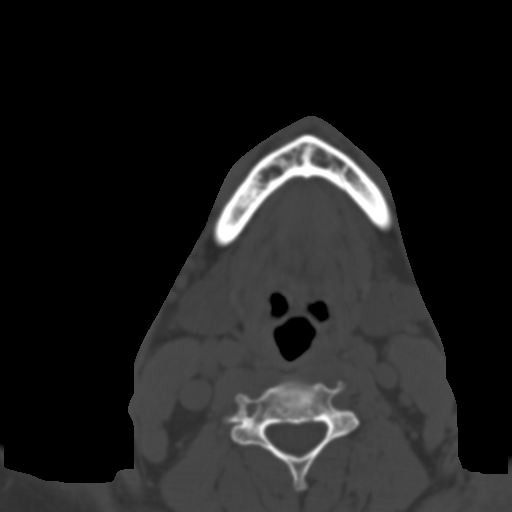
[im 28/100  bone]
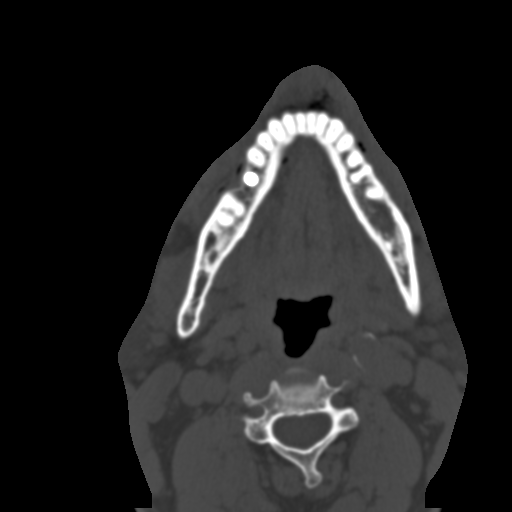
[im 35/100  bone]
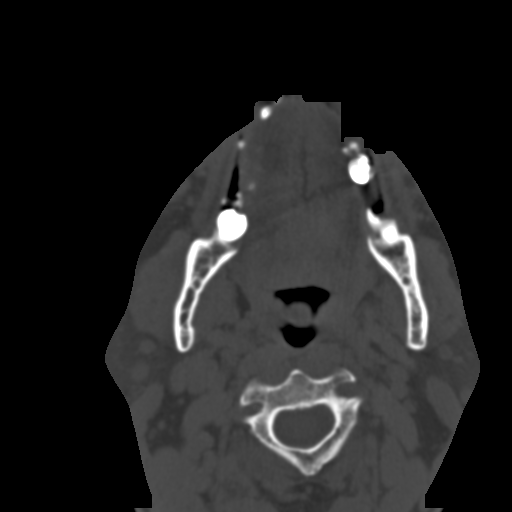
[im 45/100  brain]
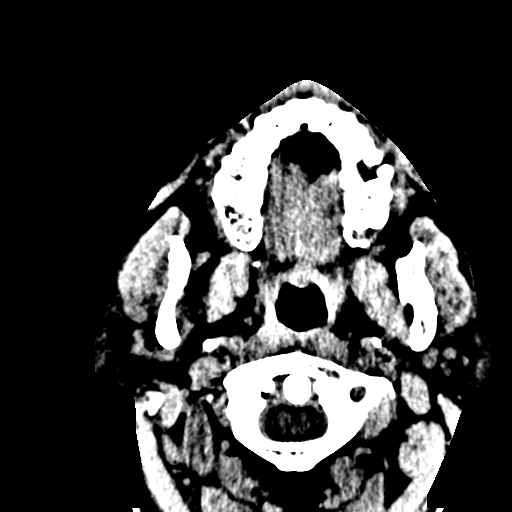
[im 45/100  bone]
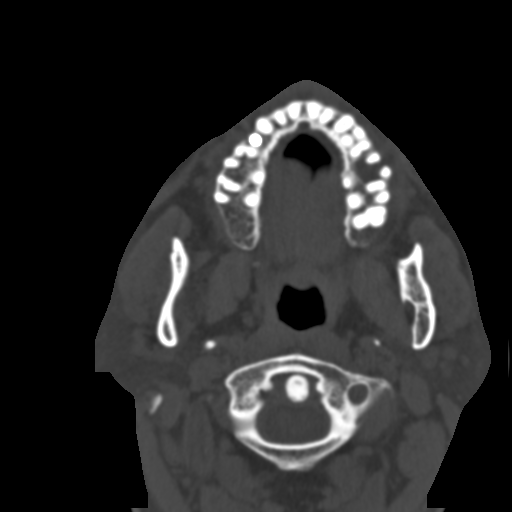
[im 55/100  bone]
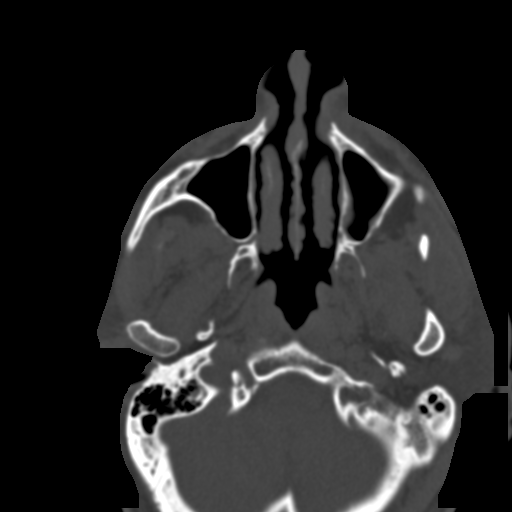
[im 65/100  bone]
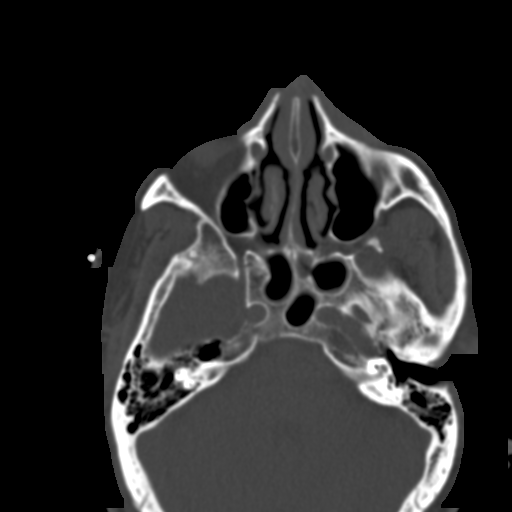
[im 76/100  bone]
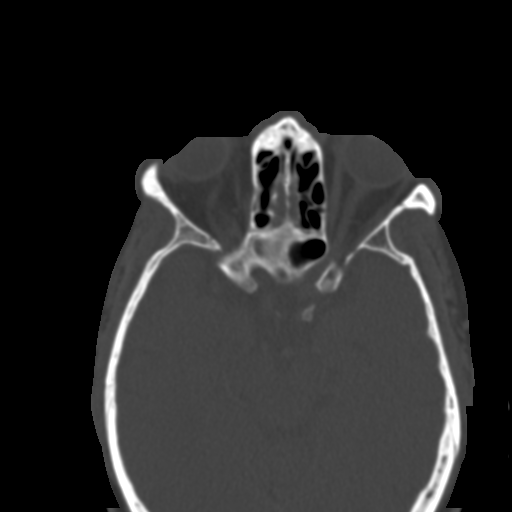
[im 82/100  brain]
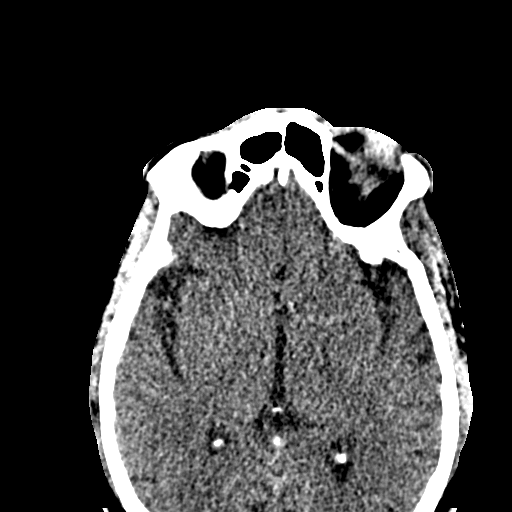
[im 82/100  bone]
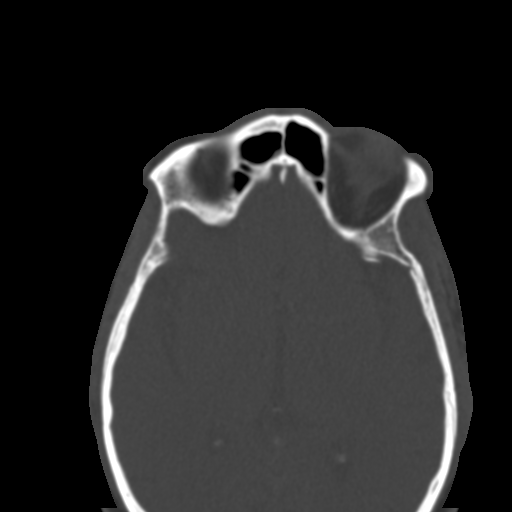
[im 93/100  bone]
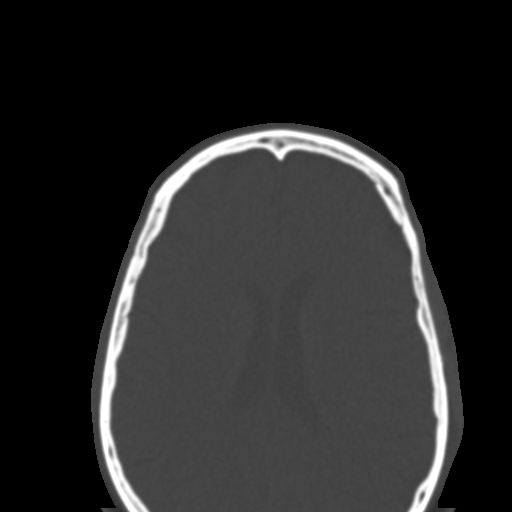

[Series 6: coronal soft tissue · coronal · 0.39mm/px · 3 of 91 slices shown]
[im 31/91  bone]
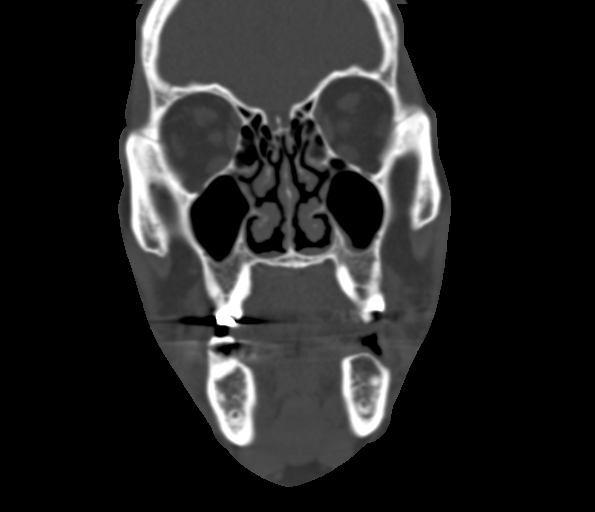
[im 41/91  bone]
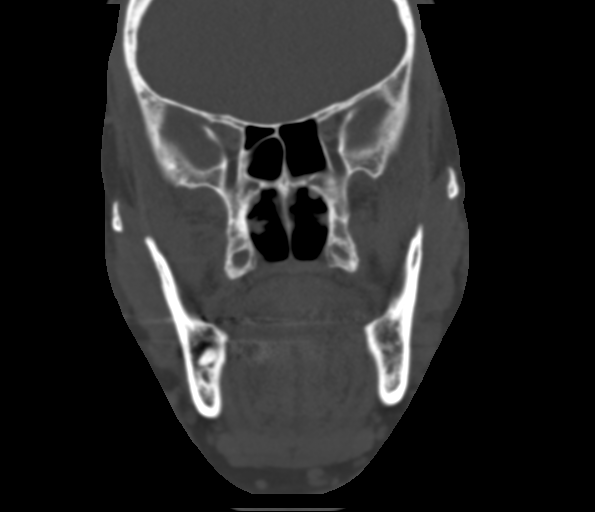
[im 51/91  bone]
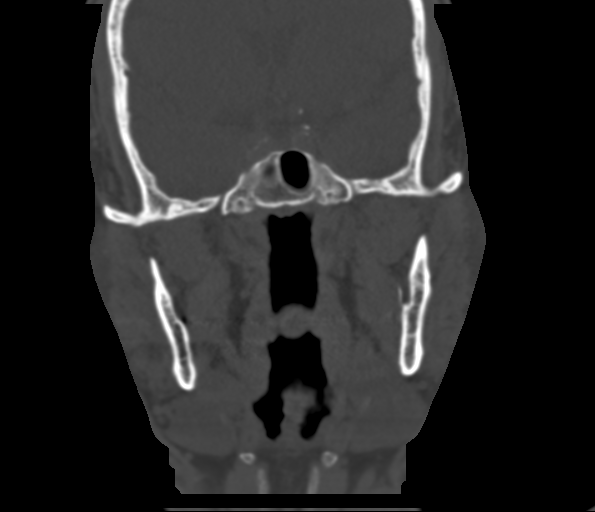

[Series 7: sagittal soft tissue · sagittal · 0.39mm/px · 3 of 83 slices shown]
[im 28/83  bone]
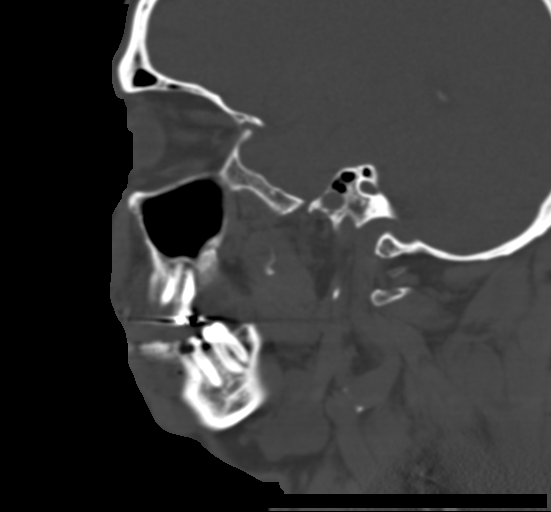
[im 42/83  bone]
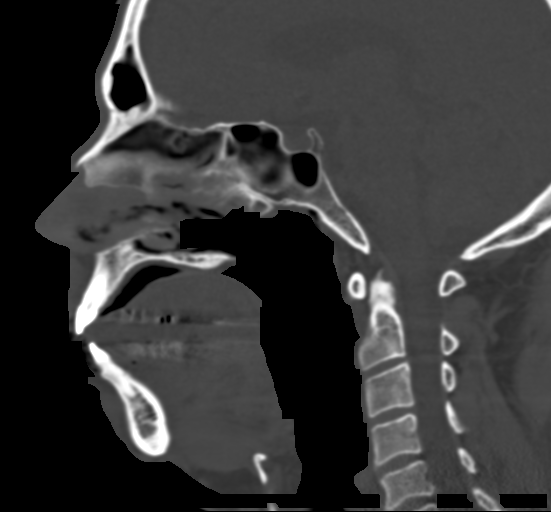
[im 55/83  bone]
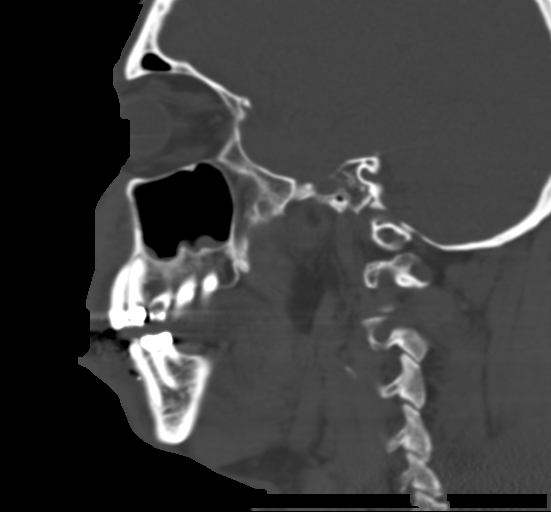

[16 of 47 positions shown; findings below may reference images not displayed]

FINDINGS: There is a laceration of the left lower lip. There is a dental Jumper
of the left lower molar. There is left infraorbital soft tissue
swelling.

The globes are intact. The orbital walls are intact. The orbital
floors are intact. The maxilla is intact. The mandible is intact.
The zygomatic arches are intact. The nasal septum is midline. There
is no nasal bone fracture. The temporomandibular joints are normal.

There is mild pansinus mucosal thickening. The visualized portions
of the mastoid sinuses are well aerated.

There is bilateral carotid artery atherosclerosis.
IMPRESSION: 1. No acute osseous injury of the maxillofacial bones.
2. Laceration of the left lower lip.

## 2016-12-21 IMAGING — US US ABDOMEN COMPLETE
1 series · 14 of 25 positions shown · non-contrast
Comparison: 02/19/2015 .

CLINICAL DATA: Elevated LFTs.

EXAM:
ULTRASOUND ABDOMEN COMPLETE

[Series 1: us abdomen complete · 0.18mm/px · 14 of 90 slices shown]
[im 1/90]
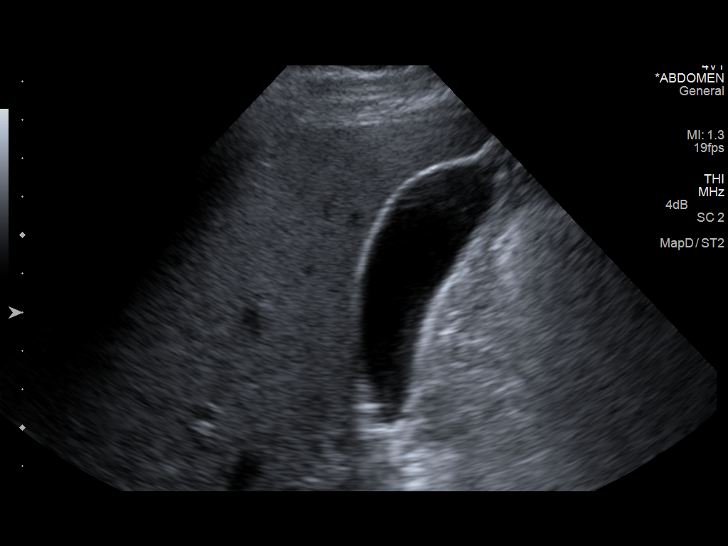
[im 8/90]
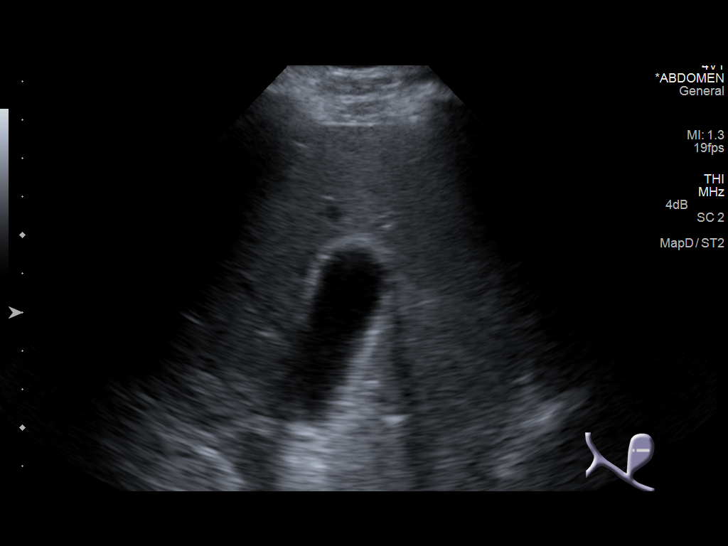
[im 15/90]
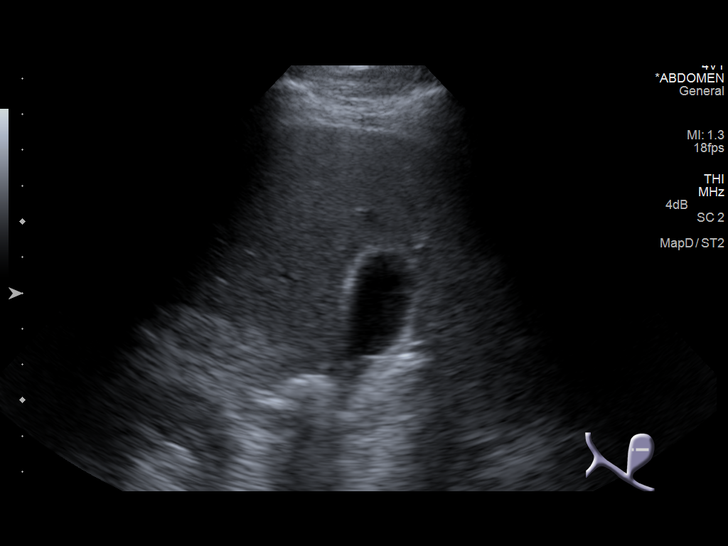
[im 23/90]
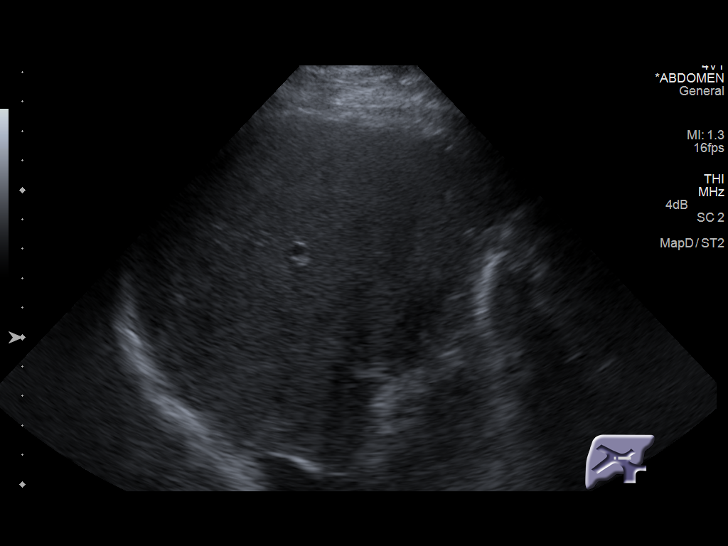
[im 30/90]
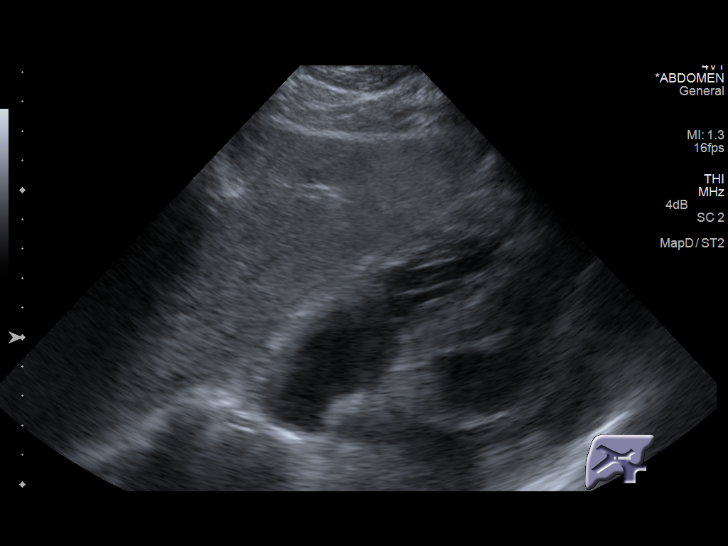
[im 34/90]
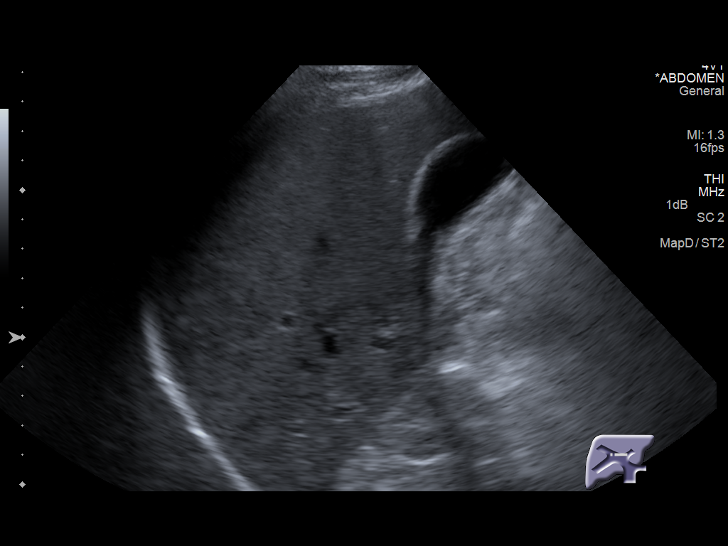
[im 41/90]
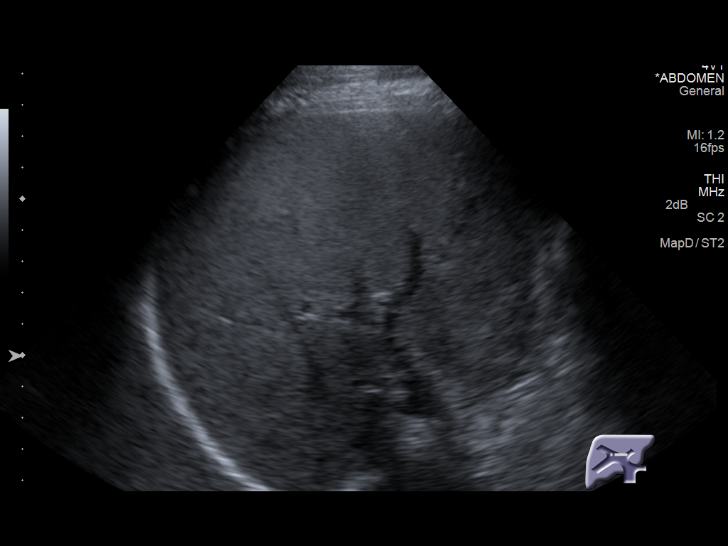
[im 49/90]
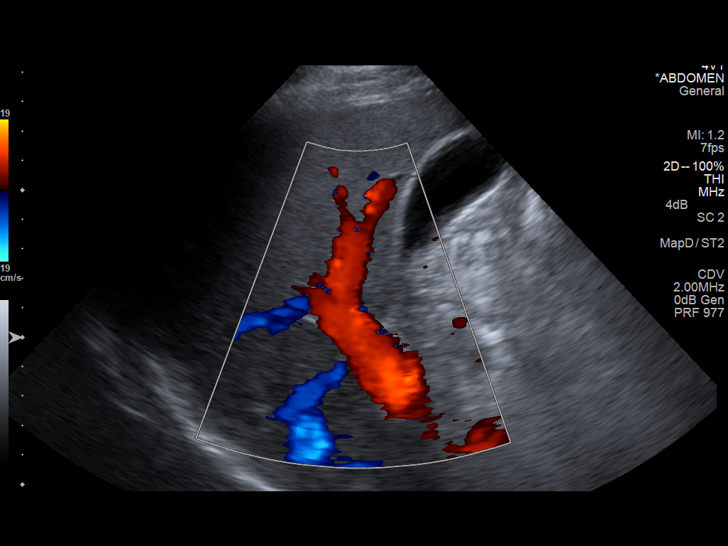
[im 56/90]
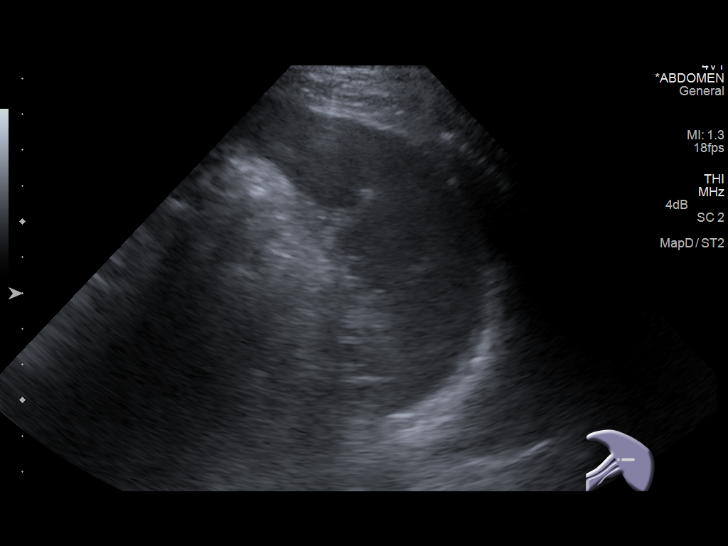
[im 60/90]
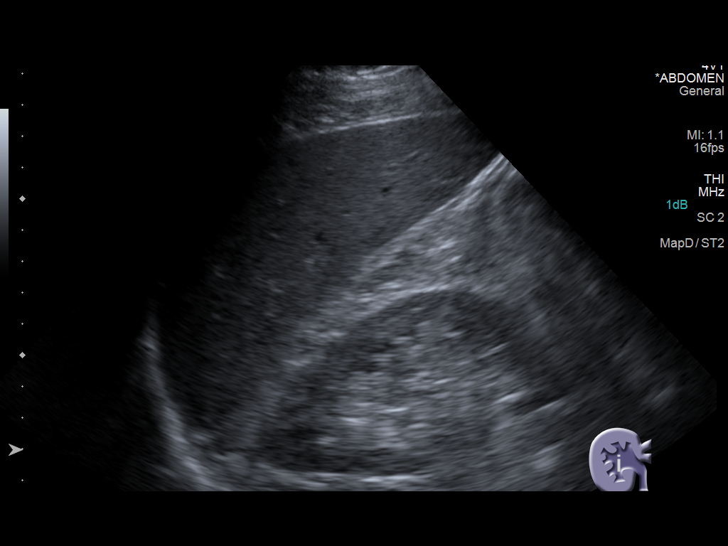
[im 67/90]
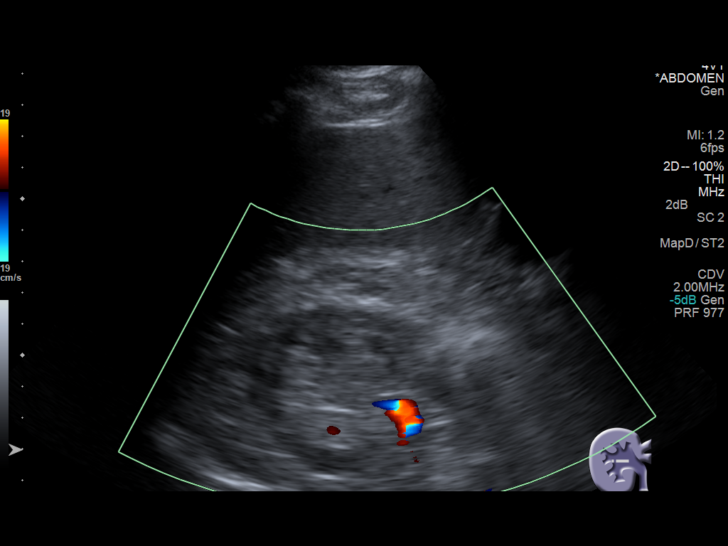
[im 75/90]
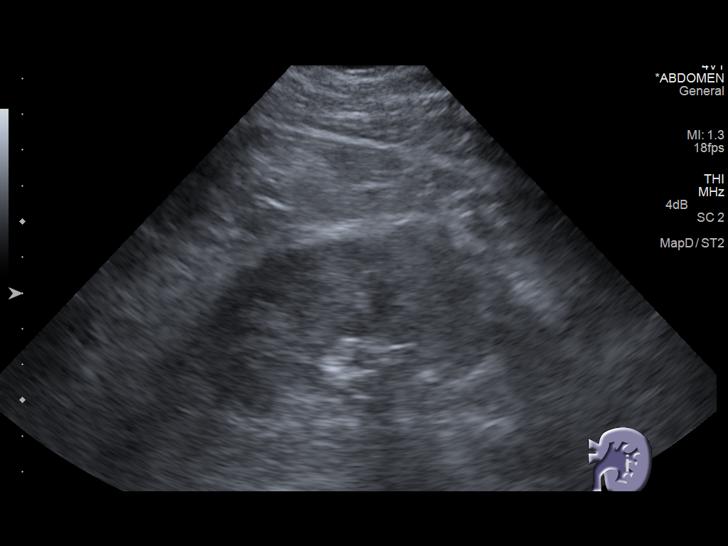
[im 82/90]
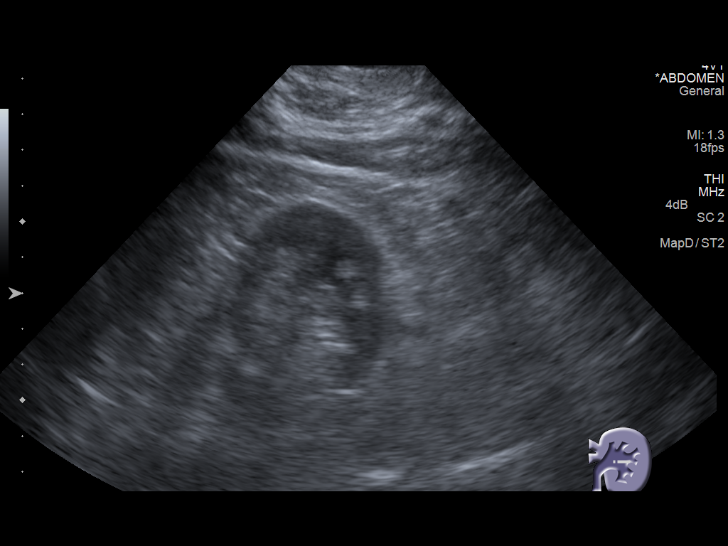
[im 90/90]
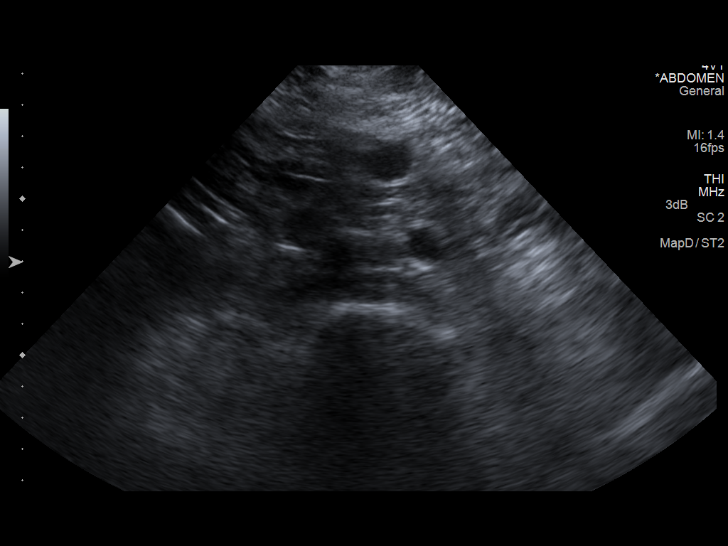

[14 of 25 positions shown; findings below may reference images not displayed]

FINDINGS: Gallbladder: No gallstones or wall thickening visualized. No
sonographic Murphy sign noted.

Common bile duct: Diameter: 2.4 mm

Liver: Liver is mildly echogenic. Fatty infiltration cannot be
excluded. No focal abnormality identified.

IVC: No abnormality visualized.

Pancreas: Visualized portion unremarkable.

Spleen: Size and appearance within normal limits.

Right Kidney: Length: 10.3 cm. Echogenicity within normal limits. No
mass or hydronephrosis visualized.

Left Kidney: Length: 10.7 cm. Echogenicity within normal limits. No
mass or hydronephrosis visualized.

Abdominal aorta: No aneurysm visualized.

Other findings: None.
IMPRESSION: Liver is mildly echogenic. Fatty infiltration cannot be excluded.
Exam is otherwise unremarkable.

## 2019-07-03 ENCOUNTER — Other Ambulatory Visit: Payer: Self-pay

## 2019-07-03 DIAGNOSIS — Z20822 Contact with and (suspected) exposure to covid-19: Secondary | ICD-10-CM

## 2019-07-05 LAB — NOVEL CORONAVIRUS, NAA: SARS-CoV-2, NAA: NOT DETECTED

## 2020-07-29 DIAGNOSIS — Z800 Family history of malignant neoplasm of digestive organs: Secondary | ICD-10-CM | POA: Insufficient documentation

## 2021-12-09 ENCOUNTER — Ambulatory Visit: Payer: 59 | Admitting: Podiatry

## 2021-12-09 ENCOUNTER — Ambulatory Visit (INDEPENDENT_AMBULATORY_CARE_PROVIDER_SITE_OTHER): Payer: 59

## 2021-12-09 ENCOUNTER — Other Ambulatory Visit: Payer: Self-pay

## 2021-12-09 DIAGNOSIS — M722 Plantar fascial fibromatosis: Secondary | ICD-10-CM | POA: Diagnosis not present

## 2021-12-09 DIAGNOSIS — S9032XA Contusion of left foot, initial encounter: Secondary | ICD-10-CM | POA: Diagnosis not present

## 2021-12-09 DIAGNOSIS — N1831 Chronic kidney disease, stage 3a: Secondary | ICD-10-CM | POA: Insufficient documentation

## 2021-12-09 MED ORDER — MELOXICAM 15 MG PO TABS: 15.0000 mg | ORAL_TABLET | Freq: Every day | ORAL | 3 refills | Status: DC

## 2021-12-09 NOTE — Patient Instructions (Signed)

## 2021-12-14 NOTE — Progress Notes (Signed)
°  Subjective:  Patient ID: William Weaver, male    DOB: 11/08/1955,  MRN: [id number]  Chief Complaint  Patient presents with   Plantar Fasciitis    Left heel pain Last 2 weeks  Pt reports he is unable to apply pressure in heel in the morning the more he walks on it the more it reliefs-    66 y.o. male presents with the above complaint. History confirmed with patient.   Objective:  Physical Exam: warm, good capillary refill, no trophic changes or ulcerative lesions, normal DP and PT pulses, and normal sensory exam. Left Foot: point tenderness over the heel pad and gastrocnemius equinus is noted with a positive silverskiold test Radiographs: Multiple views x-ray of the left foot: no fracture, dislocation, swelling or degenerative changes noted Assessment:   1. Plantar fasciitis, left   2. Contusion of left foot, initial encounter      Plan:  Patient was evaluated and treated and all questions answered.  Discussed the etiology and treatment options for plantar fasciitis including stretching, formal physical therapy, supportive shoegears such as a running shoe or sneaker, pre fabricated orthoses, injection therapy, and oral medications. We also discussed the role of surgical treatment of this for patients who do not improve after exhausting non-surgical treatment options.   -XR reviewed with patient -Educated patient on stretching and icing of the affected limb -Injection delivered to the plantar fascia of the left foot. -Rx for meloxicam. Educated on use, risks and benefits of the medication  After sterile prep with povidone-iodine solution and alcohol, the left heel was injected with 0.5cc 2% xylocaine plain, 0.5cc 0.5% marcaine plain, 5mg  triamcinolone acetonide, and 2mg  dexamethasone was injected along the medial plantar fascia at the insertion on the plantar calcaneus. The patient tolerated the procedure well without complication.  Return in about 1 month (around 01/06/2022) for  recheck plantar fasciitis.

## 2021-12-15 ENCOUNTER — Other Ambulatory Visit: Payer: Self-pay

## 2021-12-15 ENCOUNTER — Ambulatory Visit: Payer: 59 | Admitting: Dermatology

## 2021-12-15 ENCOUNTER — Encounter: Payer: Self-pay | Admitting: Dermatology

## 2021-12-15 DIAGNOSIS — L819 Disorder of pigmentation, unspecified: Secondary | ICD-10-CM | POA: Diagnosis not present

## 2021-12-15 DIAGNOSIS — L810 Postinflammatory hyperpigmentation: Secondary | ICD-10-CM

## 2021-12-15 DIAGNOSIS — L918 Other hypertrophic disorders of the skin: Secondary | ICD-10-CM | POA: Diagnosis not present

## 2021-12-15 MED ORDER — TRI-LUMA 0.01-4-0.05 % EX CREA: TOPICAL_CREAM | CUTANEOUS | 3 refills | Status: DC

## 2021-12-15 MED ORDER — TRI-LUMA 0.01-4-0.05 % EX CREA: 1.0000 "application " | TOPICAL_CREAM | Freq: Every evening | CUTANEOUS | 3 refills | Status: DC

## 2021-12-15 NOTE — Patient Instructions (Signed)
William Weaver  plastic surgeon

## 2021-12-31 ENCOUNTER — Encounter: Payer: Self-pay | Admitting: Dermatology

## 2021-12-31 NOTE — Progress Notes (Signed)
° °  Follow-Up Visit   Subjective  William Weaver is a 66 y.o. male who presents for the following: New Patient (Initial Visit) (Pt states he fell and hurt his nose but it didn't break and he has bruising from that under his eyes. He wants help with the bruising. Lesions around neck x 10 years. Pt states he has bags under his eyes he would like you to look at. Pt states he has discoloration of his skin on top of his head.).  Skull areas on scalp and face, face occurred after a fall using Location:  Duration:  Quality:  Associated Signs/Symptoms: Modifying Factors:  Severity:  Timing: Context:   Objective  Well appearing patient in no apparent distress; mood and affect are within normal limits. Left Malar Cheek, Right Malar Cheek With the patient's history of bruising after injury, I suspect this pigmentation is postinflammatory rather than melasma.  Anterior Mid Neck Pedunculated pigmented 3 mm papule  Mid Frontal Scalp Mixed hypo-/hyperpigmentation on scalp may be a combination of postinflammatory and ethnic variation.    A focused examination was performed including head and neck.. Relevant physical exam findings are noted in the Assessment and Plan.   Assessment & Plan    Post-inflammatory hyperpigmentation (2) Left Malar Cheek; Right Malar Cheek  Discussed essentially all available treatments including weight-based patient told that no therapy is consistently effective.  He does want to try something so prescription for generic Tri-Luma provided; warned of cost and likely is not covered by insurance.  Related Medications Fluocin-Hydroquinone-Tretinoin (TRI-LUMA) 0.01-4-0.05 % CREA Apply to affected area nightly. 30 day supply  Skin tag Anterior Mid Neck  No intervention initiated.  Dyschromia Mid Frontal Scalp  No treatment initiated      I, Lavonna Monarch, MD, have reviewed all documentation for this visit.  The documentation on 12/31/21 for the exam,  diagnosis, procedures, and orders are all accurate and complete.

## 2022-01-24 ENCOUNTER — Ambulatory Visit: Payer: 59 | Admitting: Podiatry

## 2022-02-17 ENCOUNTER — Encounter: Payer: 59 | Admitting: Dermatology

## 2022-04-14 ENCOUNTER — Encounter: Payer: 59 | Admitting: Dermatology

## 2022-10-13 ENCOUNTER — Institutional Professional Consult (permissible substitution): Payer: 59 | Admitting: Neurology

## 2023-03-02 ENCOUNTER — Other Ambulatory Visit: Payer: Self-pay | Admitting: Internal Medicine

## 2023-03-02 DIAGNOSIS — E782 Mixed hyperlipidemia: Secondary | ICD-10-CM

## 2023-04-10 ENCOUNTER — Other Ambulatory Visit: Payer: Self-pay | Admitting: Family Medicine

## 2023-04-10 DIAGNOSIS — E041 Nontoxic single thyroid nodule: Secondary | ICD-10-CM

## 2023-04-14 ENCOUNTER — Other Ambulatory Visit: Payer: 59

## 2023-04-21 ENCOUNTER — Other Ambulatory Visit: Payer: 59

## 2023-04-26 ENCOUNTER — Other Ambulatory Visit: Payer: 59

## 2023-05-09 ENCOUNTER — Ambulatory Visit
Admission: RE | Admit: 2023-05-09 | Discharge: 2023-05-09 | Disposition: A | Discharge status: Home / Self Care | Payer: 59 | Source: Ambulatory Visit | Attending: Family Medicine | Admitting: Family Medicine

## 2023-05-09 DIAGNOSIS — E041 Nontoxic single thyroid nodule: Secondary | ICD-10-CM

## 2023-12-05 NOTE — Progress Notes (Signed)
 Diagnosis:  1. Inflammation of foot joint      2. Acute idiopathic gout of left foot     working diagnosis; left great toe joint     Assessment & Plan 1.  Inflammation left great toe joint (working diagnosis of idiopathic gout flare). The patient reports a recurrence of gout symptoms, this time in the left foot, which started 3 days ago. He describes significant pain in the left big toe joint without noticeable swelling. The patient has a history of gout confirmed by arthrocentesis on 07/04/2023, which revealed intracellular uric acid crystals. He has been managing his symptoms with dietary modifications, including reducing alcohol and shellfish intake, and using ice. He also consumes coconut water daily, which he believes helps lower his blood pressure due to its high potassium content.  Today, per patient's request, a corticosteroid injection was administered today in an attempt to alleviate the current symptoms (since he states this helped him on the contralateral foot previously).  See procedure below. He is advised to inform his primary care physician about the recurrence of gout and the treatment received. If symptoms persist or recur, additional medications may be considered.  However, podiatry can typically provide short-term/stopgap relief and defers long-term management to PCP/rheumatology.  PROCEDURE Arthrocentesis was performed on 07/04/2023, confirming the presence of intracellular uric acid crystals.  An injection was administered today to alleviate the current symptoms of gout.    Future Appt.:  Future Appointments  Date Time Provider Department Center  02/16/2024  3:00 PM [address] [place], PA-C [facility] GEN DE [facility] [address]  10/15/2024  9:45 AM Alm JINNY Jewett, MD [facility] CAR CC [facility] CC [facility]     12/05/2023   Chief Complaint:  Chief Complaint  Patient presents with  . Follow-up    Patient states Another gout attack for about 3 days.  I have tried ice but that has not  helped.  Left foot   History of Present Illness William Weaver is a male who returns after initially being seen in 2024. We have objective confirmation that he did, in fact, have gout in the right great toe joint based on arthrocentesis performed on 07/04/2023, which confirmed the presence of intracellular uric acid crystals. He believes he is having yet another gout attack, and it has been going on for the past 3 days. This time, it is on his left foot.  He reports experiencing severe pain in his left foot, which he attributes to a recurrence of gout. The onset of this episode was sudden, occurring 3 days ago. He has been managing his condition by reducing alcohol intake and avoiding shellfish, which he identifies as potential triggers. His primary care physician is not yet aware of this current episode, but he plans to inform him during his upcoming physical examination in April 2025. He expresses a desire for another injection, recalling that the previous one administered last summer provided relief within a day. He also applies ice to the affected area for 20-minute intervals, which typically alleviates the discomfort. His daily regimen includes coconut water, which he believes aids in blood pressure control due to its high potassium content, and protein shakes, as he often skips lunch. His recent diet has consisted primarily of soups, chicken broth, and chicken pieces.  SOCIAL HISTORY He admits to alcohol consumption and shellfish intake, which he identifies as potential triggers for his gout.  PCP is listed as Dr. Massie Rosalva Sewer, DO   *Note - This document was created using the aid  of voice recognition Dragon dictation software  Review of Systems A complete ROS was performed with pertinent positives/negatives noted in the HPI. The remainder of the ROS are negative.  Vitals: Vitals:   12/05/23 1037  BP: 136/90    Past Medical Hx:  Past Medical History:  Diagnosis Date  . Benign  hypertension 11/24/2011  . Colon polyp   . History of colectomy 11/24/2011  . History of paronychia of finger 11/24/2011  . Hyperlipidemia   . Hypertension complications   . Incisional hernia 11/24/2011  . Status post hernia repair 03/29/2012  . Ventral hernia 10/21/2011    Current Meds:   Current Outpatient Medications:  .  amLODIPine (NORVASC) 10 mg tablet, Take 10 mg by mouth Once Daily., Disp: 90 tablet, Rfl: 1 .  aspirin 81 mg EC tablet, Take 162 mg by mouth Once Daily., Disp: , Rfl:  .  chlorthalidone (HYGROTON) 25 mg tablet, Take 1 tablet (25 mg total) by mouth daily., Disp: 90 tablet, Rfl: 3 .  labetaloL (NORMODYNE) 200 mg tablet, Take 2 tablets (400 mg total) by mouth in the morning and 2 tablets (400 mg total) at noon and 2 tablets (400 mg total) in the evening., Disp: 540 tablet, Rfl: 3 .  multivitamin (THERAGRAN) tab tablet, Take 1 tablet by mouth Once Daily., Disp: , Rfl:  .  olmesartan (BENICAR) 40 mg tablet, Take 40 mg by mouth Once Daily., Disp: 30 tablet, Rfl: 5 .  spironolactone (ALDACTONE) 25 mg tablet, 1 tablet., Disp: , Rfl:  .  tadalafiL (Cialis) 20 mg tablet, half tablet 1-2 hours before an encounter, if not successful can increase to whole tablet the next day [goodrx] Orally Once a day for 30 days, Disp: , Rfl:  .  vardenafiL (Levitra) 20 mg tab, Take 20 mg by mouth daily as needed (erectile dysfunction)., Disp: , Rfl:   Allergies:  Allergies  Allergen Reactions  . Penicillin G Other (See Comments)    Reaction: Tremor (intolerance)     Past Surgical Hx:  Past Surgical History:  Procedure Laterality Date  . ABDOMINAL PERINEAL BOWEL RESECTION W/ ILEOANAL POUCH     Procedure: ABDOMINAL PERINEAL BOWEL RESECTION W/ ILEOANAL POUCH  . APPENDECTOMY     Procedure: APPENDECTOMY  . COLON SURGERY     Procedure: COLON SURGERY  . COLONOSCOPY     Procedure: COLONOSCOPY  . FINGER SURGERY     Procedure: FINGER SURGERY  . HERNIA REPAIR     Procedure: HERNIA REPAIR  .  INCISIONAL HERNIA REPAIR  03/28/2012   Procedure: INCISIONAL HERNIA REPAIR;  Surgeon: Garnette Jayson Ginger, MD;  Location: [facility] MAIN OR;  Service: General;  Laterality: N/A;  With mesh placement  . TREATMENT FISTULA ANAL     Procedure: TREATMENT FISTULA ANAL    Family Hx: Family History  Problem Relation Name Age of Onset  . Hypertension Mother    . Migraines Father    . Colon cancer Father    . Anesthesia problems Neg Hx      Social Hx: Social History   Socioeconomic History  . Marital status: Single    Spouse name: Not on file  . Number of children: Not on file  . Years of education: Not on file  . Highest education level: Not on file  Occupational History  . Not on file  Tobacco Use  . Smoking status: Former    Current packs/day: 0.00    Types: Cigarettes    Quit date: 12/28/2009    Years  since quitting: 13.9  . Smokeless tobacco: Never  Substance and Sexual Activity  . Alcohol use: Yes    Alcohol/week: 8.3 standard drinks of alcohol  . Drug use: No  . Sexual activity: Not on file  Other Topics Concern  . Not on file  Social History Narrative  . Not on file   Social Drivers of Health   Food Insecurity: Not on file  Transportation Needs: Not on file  Safety: Low Risk  (10/13/2023)   Safety   . How often does anyone, including family and friends, physically hurt you?: Never   . How often does anyone, including family and friends, insult or talk down to you?: Never   . How often does anyone, including family and friends, threaten you with harm?: Never   . How often does anyone, including family and friends, scream or curse at you?: Never  Living Situation: Not on file     Exam:  Palpable pedal pulses; BCR is noted Epicritic sensation is grossly intact Cicatrix noted dorsum first MTPJ left Mild pronated foot posture with stance Mild bunion prominence bilateral Inflammation (i.e. erythema, edema, and calor) noted great toe joint left No open wounds No  crepitus  Moderate tenderness with palpation and range of motion first MTPJ left No midfoot or ankle tenderness No calf tenderness or lymphangitis  Small joint arthrocentesis: L great MTP on 12/05/2023 10:45 AM Indications: joint swelling and pain Details: 27 G needle, dorsal approach Medications: 4 mg dexAMETHasone  4 mg/mL; 1 mL lidocaine  PF 20 mg/mL (2 %) Outcome: tolerated well, no immediate complications Procedure, treatment alternatives, risks and benefits explained, specific risks discussed. Consent was given by the patient. Immediately prior to procedure a time out was called to verify the correct patient, procedure, equipment, support staff and site/side marked as required. Patient was prepped and draped in the usual sterile fashion.

## 2023-12-05 NOTE — Telephone Encounter (Signed)
 Patient confirmed appt today at 9:11 am per impact team. No further needs.

## 2023-12-05 NOTE — Telephone Encounter (Signed)
 Patient was booked first availability.  Patient is requesting a sooner appointment.  Please assist with request.   Has gout in his left foot and would really like to be seen today,.  Call back number:[phone]

## 2023-12-08 NOTE — Telephone Encounter (Signed)
 Please advise. Patient would like rx  sent to pharmacy.

## 2023-12-08 NOTE — Telephone Encounter (Signed)
 Patient returning call. He states he did not see the response to his message in the portal.   He states that he does want the prescription.   His preferred pharmacy:  [facility] # [id number], [place] - [address] - PHONE: [phone] - FAX: [phone]   He also has some questions about the prescription and his condition.    He can be reached at: [phone]

## 2023-12-08 NOTE — Telephone Encounter (Signed)
 Patient calling stating he now would like medication sent in to pharmacy. Callback [phone]   -would also like to know what the medication is.

## 2023-12-18 ENCOUNTER — Other Ambulatory Visit: Payer: Self-pay | Admitting: Internal Medicine

## 2023-12-18 DIAGNOSIS — E269 Hyperaldosteronism, unspecified: Secondary | ICD-10-CM

## 2024-01-19 ENCOUNTER — Other Ambulatory Visit: Payer: 59

## 2024-03-22 ENCOUNTER — Other Ambulatory Visit: Payer: Self-pay | Admitting: Internal Medicine

## 2024-03-22 ENCOUNTER — Encounter: Payer: Self-pay | Admitting: Internal Medicine

## 2024-03-22 DIAGNOSIS — E269 Hyperaldosteronism, unspecified: Secondary | ICD-10-CM

## 2024-04-04 ENCOUNTER — Other Ambulatory Visit

## 2024-04-16 ENCOUNTER — Ambulatory Visit
Admission: RE | Admit: 2024-04-16 | Discharge: 2024-04-16 | Disposition: A | Discharge status: Home / Self Care | Source: Ambulatory Visit | Attending: Internal Medicine | Admitting: Internal Medicine

## 2024-04-16 DIAGNOSIS — E269 Hyperaldosteronism, unspecified: Secondary | ICD-10-CM

## 2024-04-16 MED ORDER — IOPAMIDOL (ISOVUE-300) INJECTION 61%
100.0000 mL | Freq: Once | INTRAVENOUS | Status: AC | PRN
  Administered 2024-04-16: 100 mL via INTRAVENOUS

## 2024-05-02 ENCOUNTER — Other Ambulatory Visit: Payer: Self-pay | Admitting: Internal Medicine

## 2024-05-02 DIAGNOSIS — E041 Nontoxic single thyroid nodule: Secondary | ICD-10-CM

## 2024-05-09 ENCOUNTER — Ambulatory Visit
Admission: RE | Admit: 2024-05-09 | Discharge: 2024-05-09 | Disposition: A | Discharge status: Home / Self Care | Source: Ambulatory Visit | Attending: Internal Medicine | Admitting: Internal Medicine

## 2024-05-09 DIAGNOSIS — E041 Nontoxic single thyroid nodule: Secondary | ICD-10-CM

## 2024-11-13 NOTE — Progress Notes (Unsigned)
 " Cardiology Office Note:    Date:  11/13/2024   ID:  William Weaver, DOB 1956-08-21, MRN [id number]  PCP:  Valentin Skates, DO  Cardiologist:  None  Electrophysiologist:  None   Referring MD: Valentin Skates, DO   No chief complaint on file. ***  History of Present Illness:    William Weaver is a 69 y.o. male with a hx of resistant hypertension, hyperaldosteronism who is referred by Dr. Georgann for evaluation of hypertension.  Past Medical History:  Diagnosis Date   Anal fissure    Calcium deposits in tendon    KNEE   Elevated LFTs    Essential hypertension, benign    Frozen shoulder    Hemorrhoid    fistula   Migraine, unspecified, without mention of intractable migraine without mention of status migrainosus    Dr. Phill [facility]   Mixed hyperlipidemia    Nontoxic uninodular goiter    Tubular adenoma of colon 07/2010   MULTIPLE    Past Surgical History:  Procedure Laterality Date   DIAGNOSTIC LAPAOSCOPY     hernia repair   HEMICOLECTOMY     with ileocolonic anastomosis    Current Medications: Active Medications[1]   Allergies:   Penicillin g   Social History   Socioeconomic History   Marital status: Divorced    Spouse name: Not on file   Number of children: 2   Years of education: Not on file   Highest education level: Not on file  Occupational History   Occupation: data processing  Tobacco Use   Smoking status: Former    Current packs/day: 0.00    Types: Cigarettes    Quit date: 10/31/2009    Years since quitting: 15.0   Smokeless tobacco: Never  Substance and Sexual Activity   Alcohol use: Yes    Alcohol/week: 7.0 standard drinks of alcohol    Types: 7 Standard drinks or equivalent per week    Comment: glass of wine a day   Drug use: Not on file   Sexual activity: Not on file  Other Topics Concern   Not on file  Social History Narrative   Not on file   Social Drivers of Health   Tobacco Use: Medium Risk (08/06/2024)   Received from [facility]   Patient History    Smoking Tobacco Use: Former    Smokeless Tobacco Use: Never    Passive Exposure: Not on Actuary Strain: Not on file  Food Insecurity: Not on file  Transportation Needs: Not on file  Physical Activity: Not on file  Stress: Not on file  Social Connections: Not on file  Depression (EYV7-0): Not on file  Alcohol Screen: Not on file  Housing: Not on file  Utilities: Not on file  Health Literacy: Not on file     Family History: The patient's ***family history includes Heart attack in his maternal grandfather, maternal grandmother, paternal grandfather, and paternal grandmother; Hypertension in his father and sister; Hypotension in his mother.  ROS:   Please see the history of present illness.    *** All other systems reviewed and are negative.  EKGs/Labs/Other Studies Reviewed:    The following studies were reviewed today: ***  EKG:  EKG is *** ordered today.  The ekg ordered today demonstrates ***  Recent Labs: No results found for requested labs within last 365 days.  Recent Lipid Panel No results found for: CHOL, TRIG, HDL, CHOLHDL, VLDL, LDLCALC, LDLDIRECT  Physical Exam:    VS:  There were no vitals taken for this visit.    Wt Readings from Last 3 Encounters:  05/24/15 228 lb (103.4 kg)  04/29/15 230 lb 4 oz (104.4 kg)     GEN: *** Well nourished, well developed in no acute distress HEENT: Normal NECK: No JVD; No carotid bruits LYMPHATICS: No lymphadenopathy CARDIAC: ***RRR, no murmurs, rubs, gallops RESPIRATORY:  Clear to auscultation without rales, wheezing or rhonchi  ABDOMEN: Soft, non-tender, non-distended MUSCULOSKELETAL:  No edema; No deformity  SKIN: Warm and dry NEUROLOGIC:  Alert and oriented x 3 PSYCHIATRIC:  Normal affect   ASSESSMENT:    No diagnosis found. PLAN:    Resistant hypertension: On labetalol 400 mg 3 times daily, chlorthalidone 25 mg daily, olmesartan 40 mg daily,  amlodipine 10 mg daily - Warrants workup for secondary causes.  Reportedly had secondary hypertension workup in 2015 with negative VMA levels, renal arterial duplex not suggestive of stenosis, renal level was normal.  Has not had sleep study.  Reportedly was diagnosed with hyperaldosteronism with I do not see where this diagnosis was made***  Obesity:  RTC in***  Medication Adjustments/Labs and Tests Ordered: Current medicines are reviewed at length with the patient today.  Concerns regarding medicines are outlined above.  No orders of the defined types were placed in this encounter.  No orders of the defined types were placed in this encounter.   There are no Patient Instructions on file for this visit.   Signed, Lonni LITTIE Nanas, MD  11/13/2024 5:58 PM    [facility]    [1]  No outpatient medications have been marked as taking for the 11/14/24 encounter (Appointment) with Nanas Lonni LITTIE, MD.   "

## 2024-11-14 ENCOUNTER — Other Ambulatory Visit: Payer: Self-pay

## 2024-11-14 ENCOUNTER — Inpatient Hospital Stay (HOSPITAL_COMMUNITY)
Admission: EM | Admit: 2024-11-14 | Discharge: 2024-11-17 | DRG: 683 | Disposition: A | Discharge status: Home / Self Care | Source: Ambulatory Visit | Attending: Internal Medicine | Admitting: Internal Medicine

## 2024-11-14 ENCOUNTER — Emergency Department (HOSPITAL_COMMUNITY)

## 2024-11-14 ENCOUNTER — Ambulatory Visit: Admitting: Cardiology

## 2024-11-14 ENCOUNTER — Encounter (HOSPITAL_COMMUNITY): Payer: Self-pay

## 2024-11-14 DIAGNOSIS — I10 Essential (primary) hypertension: Secondary | ICD-10-CM | POA: Diagnosis present

## 2024-11-14 DIAGNOSIS — I129 Hypertensive chronic kidney disease with stage 1 through stage 4 chronic kidney disease, or unspecified chronic kidney disease: Secondary | ICD-10-CM | POA: Diagnosis present

## 2024-11-14 DIAGNOSIS — Z880 Allergy status to penicillin: Secondary | ICD-10-CM | POA: Diagnosis not present

## 2024-11-14 DIAGNOSIS — R748 Abnormal levels of other serum enzymes: Secondary | ICD-10-CM | POA: Diagnosis present

## 2024-11-14 DIAGNOSIS — F1010 Alcohol abuse, uncomplicated: Secondary | ICD-10-CM | POA: Diagnosis present

## 2024-11-14 DIAGNOSIS — M109 Gout, unspecified: Secondary | ICD-10-CM | POA: Diagnosis present

## 2024-11-14 DIAGNOSIS — K5792 Diverticulitis of intestine, part unspecified, without perforation or abscess without bleeding: Secondary | ICD-10-CM

## 2024-11-14 DIAGNOSIS — K567 Ileus, unspecified: Secondary | ICD-10-CM | POA: Diagnosis present

## 2024-11-14 DIAGNOSIS — Z791 Long term (current) use of non-steroidal anti-inflammatories (NSAID): Secondary | ICD-10-CM | POA: Diagnosis not present

## 2024-11-14 DIAGNOSIS — Z8249 Family history of ischemic heart disease and other diseases of the circulatory system: Secondary | ICD-10-CM | POA: Diagnosis not present

## 2024-11-14 DIAGNOSIS — E782 Mixed hyperlipidemia: Secondary | ICD-10-CM | POA: Diagnosis present

## 2024-11-14 DIAGNOSIS — N1831 Chronic kidney disease, stage 3a: Secondary | ICD-10-CM | POA: Diagnosis present

## 2024-11-14 DIAGNOSIS — N179 Acute kidney failure, unspecified: Secondary | ICD-10-CM | POA: Diagnosis present

## 2024-11-14 DIAGNOSIS — E876 Hypokalemia: Secondary | ICD-10-CM | POA: Diagnosis present

## 2024-11-14 DIAGNOSIS — K5732 Diverticulitis of large intestine without perforation or abscess without bleeding: Secondary | ICD-10-CM | POA: Diagnosis present

## 2024-11-14 DIAGNOSIS — F1090 Alcohol use, unspecified, uncomplicated: Secondary | ICD-10-CM | POA: Insufficient documentation

## 2024-11-14 DIAGNOSIS — Z87891 Personal history of nicotine dependence: Secondary | ICD-10-CM

## 2024-11-14 DIAGNOSIS — N200 Calculus of kidney: Secondary | ICD-10-CM | POA: Diagnosis present

## 2024-11-14 DIAGNOSIS — Z79899 Other long term (current) drug therapy: Secondary | ICD-10-CM

## 2024-11-14 DIAGNOSIS — Z7982 Long term (current) use of aspirin: Secondary | ICD-10-CM | POA: Diagnosis not present

## 2024-11-14 DIAGNOSIS — E871 Hypo-osmolality and hyponatremia: Secondary | ICD-10-CM | POA: Diagnosis present

## 2024-11-14 DIAGNOSIS — Z860101 Personal history of adenomatous and serrated colon polyps: Secondary | ICD-10-CM

## 2024-11-14 LAB — COMPREHENSIVE METABOLIC PANEL WITH GFR
ALT: 22 U/L (ref 0–44)
AST: 18 U/L (ref 15–41)
Albumin: 4 g/dL (ref 3.5–5.0)
Alkaline Phosphatase: 81 U/L (ref 38–126)
Anion gap: 18 — ABNORMAL HIGH (ref 5–15)
BUN: 129 mg/dL — ABNORMAL HIGH (ref 8–23)
CO2: 24 mmol/L (ref 22–32)
Calcium: 9.1 mg/dL (ref 8.9–10.3)
Chloride: 85 mmol/L — ABNORMAL LOW (ref 98–111)
Creatinine, Ser: 10.4 mg/dL — ABNORMAL HIGH (ref 0.61–1.24)
GFR, Estimated: 5 mL/min — ABNORMAL LOW
Glucose, Bld: 101 mg/dL — ABNORMAL HIGH (ref 70–99)
Potassium: 3.8 mmol/L (ref 3.5–5.1)
Sodium: 126 mmol/L — ABNORMAL LOW (ref 135–145)
Total Bilirubin: 0.5 mg/dL (ref 0.0–1.2)
Total Protein: 7.5 g/dL (ref 6.5–8.1)

## 2024-11-14 LAB — CBC WITH DIFFERENTIAL/PLATELET
Abs Immature Granulocytes: 0.13 K/uL — ABNORMAL HIGH (ref 0.00–0.07)
Basophils Absolute: 0 K/uL (ref 0.0–0.1)
Basophils Relative: 1 %
Eosinophils Absolute: 0.1 K/uL (ref 0.0–0.5)
Eosinophils Relative: 1 %
HCT: 41.9 % (ref 39.0–52.0)
Hemoglobin: 15.1 g/dL (ref 13.0–17.0)
Immature Granulocytes: 2 %
Lymphocytes Relative: 18 %
Lymphs Abs: 1.2 K/uL (ref 0.7–4.0)
MCH: 32.5 pg (ref 26.0–34.0)
MCHC: 36 g/dL (ref 30.0–36.0)
MCV: 90.1 fL (ref 80.0–100.0)
Monocytes Absolute: 1.2 K/uL — ABNORMAL HIGH (ref 0.1–1.0)
Monocytes Relative: 19 %
Neutro Abs: 3.9 K/uL (ref 1.7–7.7)
Neutrophils Relative %: 59 %
Platelets: 348 K/uL (ref 150–400)
RBC: 4.65 MIL/uL (ref 4.22–5.81)
RDW: 12.1 % (ref 11.5–15.5)
WBC: 6.6 K/uL (ref 4.0–10.5)
nRBC: 0 % (ref 0.0–0.2)

## 2024-11-14 LAB — LIPASE, BLOOD: Lipase: 148 U/L — ABNORMAL HIGH (ref 11–51)

## 2024-11-14 MED ORDER — THIAMINE HCL 100 MG/ML IJ SOLN
100.0000 mg | Freq: Every day | INTRAMUSCULAR | Status: DC
  Filled 2024-11-14: qty 2

## 2024-11-14 MED ORDER — METRONIDAZOLE 500 MG/100ML IV SOLN
500.0000 mg | Freq: Once | INTRAVENOUS | Status: AC
  Administered 2024-11-15: 500 mg via INTRAVENOUS
  Filled 2024-11-14: qty 100

## 2024-11-14 MED ORDER — LORAZEPAM 1 MG PO TABS
1.0000 mg | ORAL_TABLET | ORAL | Status: DC | PRN
  Administered 2024-11-17: 1 mg via ORAL
  Filled 2024-11-14: qty 1

## 2024-11-14 MED ORDER — FOLIC ACID 1 MG PO TABS
1.0000 mg | ORAL_TABLET | Freq: Every day | ORAL | Status: DC
  Administered 2024-11-14 – 2024-11-17 (×4): 1 mg via ORAL
  Filled 2024-11-14 (×4): qty 1

## 2024-11-14 MED ORDER — THIAMINE MONONITRATE 100 MG PO TABS
100.0000 mg | ORAL_TABLET | Freq: Every day | ORAL | Status: DC
  Administered 2024-11-14 – 2024-11-17 (×4): 100 mg via ORAL
  Filled 2024-11-14 (×4): qty 1

## 2024-11-14 MED ORDER — SODIUM CHLORIDE 0.9 % IV SOLN
2.0000 g | Freq: Once | INTRAVENOUS | Status: AC
  Administered 2024-11-14: 2 g via INTRAVENOUS
  Filled 2024-11-14: qty 20

## 2024-11-14 MED ORDER — LACTATED RINGERS IV BOLUS
1000.0000 mL | Freq: Once | INTRAVENOUS | Status: AC
  Administered 2024-11-15: 1000 mL via INTRAVENOUS

## 2024-11-14 MED ORDER — SODIUM BICARBONATE 8.4 % IV SOLN: INTRAVENOUS | Status: DC

## 2024-11-14 MED ORDER — LORAZEPAM 2 MG/ML IJ SOLN: 1.0000 mg | INTRAMUSCULAR | Status: DC | PRN

## 2024-11-14 MED ORDER — ADULT MULTIVITAMIN W/MINERALS CH
1.0000 | ORAL_TABLET | Freq: Every day | ORAL | Status: DC
  Administered 2024-11-14 – 2024-11-17 (×4): 1 via ORAL
  Filled 2024-11-14 (×4): qty 1

## 2024-11-14 MED ORDER — LACTATED RINGERS IV SOLN: INTRAVENOUS | Status: DC

## 2024-11-14 NOTE — H&P (Incomplete)
 " History and Physical    William Weaver FMW:[id number] DOB: [birth year] DOA: 11/14/2024  PCP: Valentin Skates, DO  Patient coming from: Home  Chief Complaint: Abnormal lab  HPI: William Weaver is a 69 y.o. male with medical history significant of hypertension, hyperlipidemia, migraine, colon polyps, hemicolectomy with ileocolonic anastomosis sent to the ED by his PCP for evaluation of elevated creatinine on outpatient labs.  Patient states about a week ago he had a few episodes of vomiting and generalized abdominal pain.  He was having a lot of hiccups so he saw his PCP and was prescribed gabapentin and metoclopramide.  He had labs done on 1/9 which showed creatinine of 2.8 and he had repeat labs done today which showed that his creatinine had gone up to 11.2 so his PCP sent him to the ED to be evaluated.  He denies history of chronic kidney disease.  Patient states he was drinking heavily but has not had any alcoholic beverages for the past 1 week.  Denies regular NSAID use.  No longer vomiting now and has been drinking 4-5 bottles of water daily but not urinating as much and urine has been dark in color.  Denies fevers, cough, shortness of breath, chest pain, or any URI symptoms.  He stopped taking valsartan a year ago.  ED Course: Vital signs on arrival: Temperature 98 F, pulse 65, respiratory rate 16, blood pressure 112/75, and SpO2 98% on room air.  Labs showing no leukocytosis, sodium 126, chloride 85, BUN 129, creatinine 10.4, GFR 5, bicarb 24, potassium 3.8, UA pending, urine sodium and creatinine pending, blood cultures collected, lipase 148.  CT renal stone study showing acute uncomplicated diverticulitis of the mid to distal descending colon with no peridiverticular abscess or pneumoperitoneum.  Showing multiple small nonobstructive calyceal calculi bilaterally without hydronephrosis.  A few dilated segments of the proximal small bowel measuring up to 4 cm, without abrupt transition point,  probably reflecting a focal ileus.  No acute pancreatic abnormality seen on CT.  Ceftriaxone , metronidazole , and CIWA protocol ordered.  EDP discussed the case with nephrology and they recommended giving 2 L IV fluid boluses followed by continuous IV fluid infusion at 200 mL/hr. Nephrology stated that if the patient's renal function does not improve on repeat labs in the morning, then they can be formally consulted.  Review of Systems:  Review of Systems  All other systems reviewed and are negative.   Past Medical History:  Diagnosis Date   Anal fissure    Calcium deposits in tendon    KNEE   Elevated LFTs    Essential hypertension, benign    Frozen shoulder    Hemorrhoid    fistula   Migraine, unspecified, without mention of intractable migraine without mention of status migrainosus    [facility]   Mixed hyperlipidemia    Nontoxic uninodular goiter    Tubular adenoma of colon 07/2010   MULTIPLE    Past Surgical History:  Procedure Laterality Date   DIAGNOSTIC LAPAOSCOPY     hernia repair   HEMICOLECTOMY     with ileocolonic anastomosis     reports that he quit smoking about 15 years ago. His smoking use included cigarettes. He has never used smokeless tobacco. He reports current alcohol use of about 7.0 standard drinks of alcohol per week. He reports that he does not use drugs.  Allergies[1]  Family History  Problem Relation Age of Onset   Hypotension Mother    Hypertension Father  Hypertension Sister    Heart attack Maternal Grandmother    Heart attack Maternal Grandfather    Heart attack Paternal Grandmother    Heart attack Paternal Grandfather     Prior to Admission medications  Medication Sig Start Date End Date Taking? Authorizing Provider  amLODipine (NORVASC) 10 MG tablet Take 10 mg by mouth at bedtime.     [provider]  aspirin EC 81 MG tablet Take 162 mg by mouth daily.     [provider]   Carboxymethylcellulose Sodium (LUBRICANT EYE DROPS OP) Place 1 drop into both eyes daily as needed (dry eyes).    [provider]  Fluocin-Hydroquinone-Tretinoin (TRI-LUMA ) 0.01-4-0.05 % CREA Apply to affected area nightly. 30 day supply 12/15/21   Tafeen, Stuart, MD  hydrALAZINE (APRESOLINE) 25 MG tablet Take 25 mg by mouth at bedtime.  04/21/15   [provider]  labetalol (NORMODYNE) 200 MG tablet Take 200 mg by mouth 2 (two) times daily.     [provider]  meloxicam  (MOBIC ) 15 MG tablet Take 1 tablet (15 mg total) by mouth daily. 12/09/21   Silva Juliene SAUNDERS, DPM  Multiple Vitamins tablet Take 1 tablet by mouth daily.     [provider]  valsartan (DIOVAN) 320 MG tablet Take 320 mg by mouth at bedtime.     [provider]  vardenafil (LEVITRA) 20 MG tablet Take 20 mg by mouth daily as needed for erectile dysfunction.    [provider]    Physical Exam: Vitals:   11/14/24 1721 11/14/24 1740 11/14/24 2026  BP: 112/75  96/61  Pulse: 65  63  Resp: 16  18  Temp:   98 F (36.7 C)  TempSrc:   Oral  SpO2: 98%  98%  Weight:  68 kg   Height:  5' 7 (1.702 m)     Physical Exam Vitals reviewed.  Constitutional:      General: He is not in acute distress. HENT:     Head: Normocephalic and atraumatic.  Eyes:     Extraocular Movements: Extraocular movements intact.  Cardiovascular:     Rate and Rhythm: Normal rate and regular rhythm.     Heart sounds: Normal heart sounds.  Pulmonary:     Effort: Pulmonary effort is normal. No respiratory distress.     Breath sounds: Normal breath sounds.  Abdominal:     General: Bowel sounds are normal.     Palpations: Abdomen is soft.     Tenderness: There is no abdominal tenderness. There is no guarding.  Musculoskeletal:     Cervical back: Normal range of motion.     Right lower leg: No edema.     Left lower leg: No edema.  Skin:    General: Skin is warm and dry.  Neurological:      General: No focal deficit present.     Mental Status: He is alert and oriented to person, place, and time.     Labs on Admission: I have personally reviewed following labs and imaging studies  CBC: Recent Labs  Lab 11/14/24 1757  WBC 6.6  NEUTROABS 3.9  HGB 15.1  HCT 41.9  MCV 90.1  PLT 348   Basic Metabolic Panel: Recent Labs  Lab 11/14/24 1757  NA 126*  K 3.8  CL 85*  CO2 24  GLUCOSE 101*  BUN 129*  CREATININE 10.40*  CALCIUM 9.1   GFR: Estimated Creatinine Clearance: 6.4 mL/min (A) (by C-G formula based on SCr of 10.4  mg/dL (H)). Liver Function Tests: Recent Labs  Lab 11/14/24 1757  AST 18  ALT 22  ALKPHOS 81  BILITOT 0.5  PROT 7.5  ALBUMIN 4.0   Recent Labs  Lab 11/14/24 1757  LIPASE 148*   No results for input(s): AMMONIA in the last 168 hours. Coagulation Profile: No results for input(s): INR, PROTIME in the last 168 hours. Cardiac Enzymes: No results for input(s): CKTOTAL, CKMB, CKMBINDEX, TROPONINI in the last 168 hours. BNP (last 3 results) No results for input(s): PROBNP in the last 8760 hours. HbA1C: No results for input(s): HGBA1C in the last 72 hours. CBG: No results for input(s): GLUCAP in the last 168 hours. Lipid Profile: No results for input(s): CHOL, HDL, LDLCALC, TRIG, CHOLHDL, LDLDIRECT in the last 72 hours. Thyroid  Function Tests: No results for input(s): TSH, T4TOTAL, FREET4, T3FREE, THYROIDAB in the last 72 hours. Anemia Panel: No results for input(s): VITAMINB12, FOLATE, FERRITIN, TIBC, IRON, RETICCTPCT in the last 72 hours. Urine analysis: No results found for: COLORURINE, APPEARANCEUR, LABSPEC, PHURINE, GLUCOSEU, HGBUR, BILIRUBINUR, KETONESUR, PROTEINUR, UROBILINOGEN, NITRITE, LEUKOCYTESUR  Radiological Exams on Admission: CT Renal Stone Study Result Date: 11/14/2024 EXAM: CT ABDOMEN AND PELVIS WITHOUT CONTRAST 11/14/2024 06:04:24 PM TECHNIQUE:  CT of the abdomen and pelvis was performed without the administration of intravenous contrast. Multiplanar reformatted images are provided for review. Automated exposure control, iterative reconstruction, and/or weight-based adjustment of the mA/kV was utilized to reduce the radiation dose to as low as reasonably achievable. COMPARISON: 04/16/2024 CLINICAL HISTORY: Abdominal/flank pain, stone suspected. FINDINGS: LOWER CHEST: No acute abnormality. LIVER: The liver is unremarkable. GALLBLADDER AND BILE DUCTS: Gallbladder is unremarkable. No biliary ductal dilatation. SPLEEN: No acute abnormality. PANCREAS: No acute abnormality. ADRENAL GLANDS: No acute abnormality. KIDNEYS, URETERS AND BLADDER: Bilateral renal cysts are present, measuring up to 4 cm in the left lower pole. Per consensus, no follow-up is needed for simple Bosniak type 1 and 2 renal cysts, unless the patient has a malignancy history or risk factors. Multiple small nonobstructive calyceal calculi bilaterally. No hydronephrosis. No perinephric or periureteral stranding. The urinary bladder is decompressed. GI AND BOWEL: Stomach demonstrates no acute abnormality. Small duodenal diverticulum arising from the third portion of the duodenum. Postsurgical changes of a right hemicolectomy. A few dilated segments of proximal small bowel measuring up to 4 cm without abrupt transition point, possibly reflecting a focal ileus. There is inflammatory stranding about the mid descending colon involving a couple of diverticula. No peridiverticular abscess. There is no bowel obstruction. PERITONEUM AND RETROPERITONEUM: No ascites. No free air. No free pelvic fluid. VASCULATURE: Tortuous abdominal aorta with diffuse aortoiliac atherosclerosis. LYMPH NODES: No lymphadenopathy. REPRODUCTIVE ORGANS: Mild prostatomegaly. BONES AND SOFT TISSUES: Mild multilevel degenerative disc disease of the spine. Small fat containing right inguinal hernia. No acute osseous abnormality. No  focal soft tissue abnormality. IMPRESSION: 1. Acute, uncomplicated diverticulitis of the colon mid to distal descending colon. No peridiverticular abscess or pneumoperitoneum. 2. Multiple small nonobstructive calyceal calculi bilaterally, without hydronephrosis. 3. A few dilated segments of proximal small bowel measuring up to 4 cm, without abrupt transition point, probably reflecting a focal ileus. 4. Mild prostatomegaly. Electronically signed by: Rogelia Myers MD 11/14/2024 06:12 PM EST RP Workstation: [id number]    EKG: Independently reviewed.  Normal sinus rhythm, no STEMI.  Assessment and Plan  AKI on CKD stage IIIa ?Prerenal from dehydration versus ATN.  Patient states he stopped taking valsartan a year ago and denies regular NSAID use.  Does report recent heavy alcohol consumption and  vomiting a week ago resulting in poor p.o. intake.  Creatinine previously 1.4 with GFR 56 on labs done in October 2023.  Creatinine was 2.8 on recent labs done on 1/9 and on repeat labs done today it has gone up to 10.4 with BUN 129 and GFR of 5.  CT showing multiple small nonobstructive calyceal calculi bilaterally without hydronephrosis.  Nephrology recommended aggressive IV fluid hydration and repeating labs in the morning to check renal function.  Recommended formally consulting their service if renal function does not improve on repeat labs.  UA pending, urine sodium and creatinine pending.  Monitor urine output.  Avoid nephrotoxic agents.  Hyponatremia Likely due to poor p.o. intake.  Continue IV fluid hydration with normal saline and monitor renal function.  Check serum osmolarity.  Acute uncomplicated diverticulitis No fever, leukocytosis, or signs of sepsis.  Continue ceftriaxone  and metronidazole .  Follow-up blood cultures.  Possible focal ileus CT showing a few dilated segments of the proximal small bowel measuring up to 4 cm, without abrupt transition point, probably reflecting a focal ileus.  Not  actively vomiting.  Bowel rest for now and monitor closely.  Elevated lipase No abdominal pain or tenderness at this time.  No acute pancreatic abnormality seen on CT.  ?Acute alcoholic pancreatitis.  Continue IV fluid hydration and trend lipase.  Hypertension Most recent blood pressure 110/72.  Continue home meds after pharmacy med rec is done.  Alcohol use disorder No signs of withdrawal at this time.  Continue CIWA protocol.  DVT prophylaxis: SQ Heparin  Code Status: Full Code (discussed with the patient) Family Communication: ***  Consults called: ***  Level of care: {Blank single:19197::Med-Surg,Telemetry bed,Progressive Care Unit,Step Down Unit} Admission status: *** Time Spent: 75+ minutes***  Editha Ram MD [facility]  If 7PM-7AM, please contact night-coverage [email or web address]  11/14/2024, 10:27 PM         [1] Allergies Allergen Reactions   Penicillin G Other (See Comments)    Seizures, convulsions  "

## 2024-11-14 NOTE — ED Provider Triage Note (Signed)
 Emergency Medicine Provider Triage Evaluation Note  William Weaver , a 69 y.o. male  was evaluated in triage.  Pt complains of potential kidney injury.  Saw PCP and told his creatinine was 2.8 and should go to ED for AKI.  States he had a viral illness with nausea/vomiting last week but has improved.  Notes urine has been dark and has been having frequency for the past week.  Notes back pain for the past 2 to 3 days.  Denies fevers.  Review of Systems  Positive: Urinary frequency, flank pain Negative: Fevers, nausea/vomiting, chest pain, shortness of breath, abdominal pain, dysuria, hematuria  Physical Exam  BP 112/75 (BP Location: Right Arm)   Pulse 65   Resp 16   Ht 5' 7 (1.702 m)   Wt 68 kg   SpO2 98%   BMI 23.49 kg/m  Gen:   Awake, no distress   Resp:  Normal effort  MSK:   Moves extremities without difficulty  Other:    Medical Decision Making  Medically screening exam initiated at 5:50 PM.  Appropriate orders placed.  Taras Rask was informed that the remainder of the evaluation will be completed by another provider, this initial triage assessment does not replace that evaluation, and the importance of remaining in the ED until their evaluation is complete.     Neysa Thersia RAMAN, [place] 11/14/24 1751

## 2024-11-14 NOTE — ED Triage Notes (Signed)
 Pt to er, pt states he had some blood drawn at his PMD and his creatinine was 2.8, states that she was sent here to get admitted for [facility].    States that about a week ago he started having some abd pain.  States that it was a sour stomach

## 2024-11-14 NOTE — ED Triage Notes (Signed)
 Pt went to PCP for flu like sx and lab work showed CRT of 2.8, PCP asked pt to come to ER. Pt states he has been urinating less for about a week. Axox4.

## 2024-11-14 NOTE — ED Provider Notes (Signed)
 "  EMERGENCY DEPARTMENT AT [facility] Provider Note   CSN: [id number] Arrival date & time: 11/14/24  1716     Patient presents with: Results   William Weaver is a 69 y.o. male.   69 year old male history of hypertension who presents to the emergency department with AKI on outpatient blood work.  Patient had blood work drawn on 1/9 and showed that he had a creatinine of 2.8.  On 1/15 had additional blood work that was drawn and showed that he had a creatinine of 11.2.  Says he been eating less than usual recently.  Thinks he had some stomach issues.  Had lots of nausea.  Decreased urination as well.  No significant leg swelling.  Drinks alcohol but has not had any in approximately a week       Prior to Admission medications  Medication Sig Start Date End Date Taking? Authorizing Provider  amLODipine  (NORVASC ) 10 MG tablet Take 10 mg by mouth at bedtime.     [provider]  aspirin EC 81 MG tablet Take 162 mg by mouth daily.     [provider]  Carboxymethylcellulose Sodium (LUBRICANT EYE DROPS OP) Place 1 drop into both eyes daily as needed (dry eyes).    [provider]  Fluocin-Hydroquinone-Tretinoin (TRI-LUMA ) 0.01-4-0.05 % CREA Apply to affected area nightly. 30 day supply 12/15/21   Tafeen, Stuart, MD  hydrALAZINE (APRESOLINE) 25 MG tablet Take 25 mg by mouth at bedtime.  04/21/15   [provider]  labetalol  (NORMODYNE ) 200 MG tablet Take 200 mg by mouth 2 (two) times daily.     [provider]  meloxicam  (MOBIC ) 15 MG tablet Take 1 tablet (15 mg total) by mouth daily. 12/09/21   Silva Juliene SAUNDERS, DPM  Multiple Vitamins tablet Take 1 tablet by mouth daily.     [provider]  valsartan (DIOVAN) 320 MG tablet Take 320 mg by mouth at bedtime.     [provider]  vardenafil (LEVITRA) 20 MG tablet Take 20 mg by mouth daily as needed for erectile dysfunction.    [provider]    Allergies:  Penicillin g    Review of Systems  Updated Vital Signs BP 110/72 (BP Location: Right Arm)   Pulse 70   Temp 98.1 F (36.7 C) (Oral)   Resp 12   Ht 5' 7 (1.702 m)   Wt 68 kg   SpO2 100%   BMI 23.49 kg/m   Physical Exam Vitals and nursing note reviewed.  Constitutional:      General: He is not in acute distress.    Appearance: He is well-developed.  HENT:     Head: Normocephalic and atraumatic.     Right Ear: External ear normal.     Left Ear: External ear normal.     Nose: Nose normal.  Eyes:     Extraocular Movements: Extraocular movements intact.     Conjunctiva/sclera: Conjunctivae normal.     Pupils: Pupils are equal, round, and reactive to light.  Cardiovascular:     Rate and Rhythm: Normal rate and regular rhythm.     Heart sounds: Normal heart sounds.  Pulmonary:     Effort: Pulmonary effort is normal. No respiratory distress.     Breath sounds: Normal breath sounds.  Abdominal:     General: There is no distension.     Palpations: Abdomen is soft. There is no mass.     Tenderness: There is no abdominal tenderness. There  is no guarding.  Musculoskeletal:     Cervical back: Normal range of motion and neck supple.     Right lower leg: No edema.     Left lower leg: No edema.  Skin:    General: Skin is warm and dry.  Neurological:     Mental Status: He is alert. Mental status is at baseline.  Psychiatric:        Mood and Affect: Mood normal.        Behavior: Behavior normal.     (all labs ordered are listed, but only abnormal results are displayed) Labs Reviewed  COMPREHENSIVE METABOLIC PANEL WITH GFR - Abnormal; Notable for the following components:      Result Value   Sodium 126 (*)    Chloride 85 (*)    Glucose, Bld 101 (*)    BUN 129 (*)    Creatinine, Ser 10.40 (*)    GFR, Estimated 5 (*)    Anion gap 18 (*)    All other components within normal limits  CBC WITH DIFFERENTIAL/PLATELET - Abnormal; Notable for the following components:    Monocytes Absolute 1.2 (*)    Abs Immature Granulocytes 0.13 (*)    All other components within normal limits  LIPASE, BLOOD - Abnormal; Notable for the following components:   Lipase 148 (*)    All other components within normal limits  CULTURE, BLOOD (ROUTINE X 2)  CULTURE, BLOOD (ROUTINE X 2)  URINALYSIS, W/ REFLEX TO CULTURE (INFECTION SUSPECTED)  NA AND K (SODIUM & POTASSIUM), RAND UR  CREATININE, URINE, RANDOM  HIV ANTIBODY (ROUTINE TESTING W REFLEX)  BASIC METABOLIC PANEL WITH GFR  LIPASE, BLOOD  OSMOLALITY    EKG: EKG Interpretation Date/Time:  Thursday November 14 2024 18:16:36 EST Ventricular Rate:  63 PR Interval:  186 QRS Duration:  90 QT Interval:  432 QTC Calculation: 442 R Axis:   8  Text Interpretation: Normal sinus rhythm Possible Anterior infarct , age undetermined Abnormal ECG When compared with ECG of 30-Jul-2010 12:07, PREVIOUS ECG IS PRESENT Confirmed by Yolande Charleston [phone]) on 11/14/2024 7:55:54 PM  Radiology: CT Renal Stone Study Result Date: 11/14/2024 EXAM: CT ABDOMEN AND PELVIS WITHOUT CONTRAST 11/14/2024 06:04:24 PM TECHNIQUE: CT of the abdomen and pelvis was performed without the administration of intravenous contrast. Multiplanar reformatted images are provided for review. Automated exposure control, iterative reconstruction, and/or weight-based adjustment of the mA/kV was utilized to reduce the radiation dose to as low as reasonably achievable. COMPARISON: 04/16/2024 CLINICAL HISTORY: Abdominal/flank pain, stone suspected. FINDINGS: LOWER CHEST: No acute abnormality. LIVER: The liver is unremarkable. GALLBLADDER AND BILE DUCTS: Gallbladder is unremarkable. No biliary ductal dilatation. SPLEEN: No acute abnormality. PANCREAS: No acute abnormality. ADRENAL GLANDS: No acute abnormality. KIDNEYS, URETERS AND BLADDER: Bilateral renal cysts are present, measuring up to 4 cm in the left lower pole. Per consensus, no follow-up is needed for simple Bosniak type  1 and 2 renal cysts, unless the patient has a malignancy history or risk factors. Multiple small nonobstructive calyceal calculi bilaterally. No hydronephrosis. No perinephric or periureteral stranding. The urinary bladder is decompressed. GI AND BOWEL: Stomach demonstrates no acute abnormality. Small duodenal diverticulum arising from the third portion of the duodenum. Postsurgical changes of a right hemicolectomy. A few dilated segments of proximal small bowel measuring up to 4 cm without abrupt transition point, possibly reflecting a focal ileus. There is inflammatory stranding about the mid descending colon involving a couple of diverticula. No peridiverticular abscess. There is no bowel obstruction.  PERITONEUM AND RETROPERITONEUM: No ascites. No free air. No free pelvic fluid. VASCULATURE: Tortuous abdominal aorta with diffuse aortoiliac atherosclerosis. LYMPH NODES: No lymphadenopathy. REPRODUCTIVE ORGANS: Mild prostatomegaly. BONES AND SOFT TISSUES: Mild multilevel degenerative disc disease of the spine. Small fat containing right inguinal hernia. No acute osseous abnormality. No focal soft tissue abnormality. IMPRESSION: 1. Acute, uncomplicated diverticulitis of the colon mid to distal descending colon. No peridiverticular abscess or pneumoperitoneum. 2. Multiple small nonobstructive calyceal calculi bilaterally, without hydronephrosis. 3. A few dilated segments of proximal small bowel measuring up to 4 cm, without abrupt transition point, probably reflecting a focal ileus. 4. Mild prostatomegaly. Electronically signed by: Rogelia Myers MD 11/14/2024 06:12 PM EST RP Workstation: [id number]     Procedures   Medications Ordered in the ED  lactated ringers  bolus 1,000 mL (has no administration in time range)  LORazepam  (ATIVAN ) tablet 1-4 mg (has no administration in time range)    Or  LORazepam  (ATIVAN ) injection 1-4 mg (has no administration in time range)  thiamine  (VITAMIN B1) tablet 100 mg (100  mg Oral Given 11/14/24 2245)    Or  thiamine  (VITAMIN B1) injection 100 mg ( Intravenous See Alternative 11/14/24 2245)  folic acid  (FOLVITE ) tablet 1 mg (1 mg Oral Given 11/14/24 2245)  multivitamin with minerals tablet 1 tablet (1 tablet Oral Given 11/14/24 2245)  cefTRIAXone  (ROCEPHIN ) 2 g in sodium chloride  0.9 % 100 mL IVPB (0 g Intravenous Stopped 11/14/24 2315)    And  metroNIDAZOLE  (FLAGYL ) IVPB 500 mg (500 mg Intravenous New Bag/Given 11/15/24 0030)  metroNIDAZOLE  (FLAGYL ) IVPB 500 mg (has no administration in time range)  cefTRIAXone  (ROCEPHIN ) 2 g in sodium chloride  0.9 % 100 mL IVPB (has no administration in time range)  0.9 %  sodium chloride  infusion (has no administration in time range)  heparin  injection 5,000 Units (has no administration in time range)  acetaminophen  (TYLENOL ) tablet 650 mg (has no administration in time range)    Or  acetaminophen  (TYLENOL ) suppository 650 mg (has no administration in time range)  lactated ringers  bolus 1,000 mL (1,000 mLs Intravenous New Bag/Given 11/15/24 0030)    Clinical Course as of 11/15/24 0106  Thu Nov 14, 2024  2057 Dr Tobie from nephrology consulted.  Recommend starting him on 200 mL/hr of lactated Ringer 's after his fluid bolus.  Also requesting urine electrolytes to be sent [RP]    Clinical Course User Index [RP] Yolande Lamar BROCKS, MD                                 Medical Decision Making Amount and/or Complexity of Data Reviewed Labs: ordered.  Risk OTC drugs. Prescription drug management. Decision regarding hospitalization.   William Weaver is a 69 year old male history of hypertension who presents to the emergency department with AKI on outpatient blood work.    Initial Ddx:  AKI, hyperkalemia, volume overload, prerenal AKI, obstruction, medication side effect, alcohol withdrawal  MDM/Course:  Patient presents emergency department with AKI.  Has been eating less than usual recently and had some abdominal  discomfort.  Also reports alcohol use but says he has not drink in a week.  Does not appear to be in active withdrawals right now.  Had blood work performed as an outpatient that shows that his creatinine has gone from 2.8 to 11 in a matter of days.  On exam is not in acute distress.  Does not appear to be volume overloaded  or significantly hypertensive.  No focal abdominal tenderness to palpation.  CT abdomen pelvis showed diverticulitis that is uncomplicated but no signs of urinary obstruction.  Started on antibiotics for the diverticulitis.  Labs here show that he has a BUN of 129 and creatinine of 10.4.  Suspect is prerenal based on his symptoms and decreased p.o. intake recently.  Was ordered for IV fluids.  Talked to nephrology who recommended urine studies and maintenance fluids at an aggressive rate.  If his renal function does not improve they recommend having hospitalist reach back out to him.  Upon re-evaluation patient remained stable.  Admitted to hospitalist for further management.  This patient presents to the ED for concern of complaints listed in HPI, this involves an extensive number of treatment options, and is a complaint that carries with it a high risk of complications and morbidity. Disposition including potential need for admission considered.   Dispo: Admit to Floor  I have reviewed the patients home medications and made adjustments as needed Additional history obtained from significant other Records reviewed Outpatient Clinic Notes The following labs were independently interpreted: Chemistry and show AKI I independently reviewed the following imaging with scope of interpretation limited to determining acute life threatening conditions related to emergency care: CT Abdomen/Pelvis and agree with the radiologist interpretation with the following exceptions: none I personally reviewed and interpreted cardiac monitoring: normal sinus rhythm  I personally reviewed and interpreted the  pt's EKG: see above for interpretation  Consults: Hospitalist and Nephrology Social Determinants of health:  Geriatric  Portions of this note were generated with Scientist, clinical (histocompatibility and immunogenetics). Dictation errors may occur despite best attempts at proofreading.      Final diagnoses:  AKI (acute kidney injury)  Diverticulitis    ED Discharge Orders     None          Yolande Lamar BROCKS, MD 11/15/24 0106  "

## 2024-11-14 NOTE — ED Notes (Signed)
 Patient unable to provide urine sample. KIT

## 2024-11-14 NOTE — H&P (Signed)
 " History and Physical    [name] FMW:[id number] DOB: October 02, 1956 DOA: 11/14/2024  PCP: Valentin Skates, DO  Patient coming from: Home  Chief Complaint: Abnormal lab  HPI: William Weaver is a 69 y.o. male with medical history significant of hypertension, hyperlipidemia, migraine, colon polyps, hemicolectomy with ileocolonic anastomosis sent to the ED by his PCP for evaluation of elevated creatinine on outpatient labs.  Patient states about a week ago he had a few episodes of vomiting and generalized abdominal pain.  He was having a lot of hiccups so he saw his PCP and was prescribed gabapentin and metoclopramide.  He had labs done on 1/9 which showed creatinine of 2.8 and he had repeat labs done today which showed that his creatinine had gone up to 11.2 so his PCP sent him to the ED to be evaluated.  He denies history of chronic kidney disease.  Patient states he was drinking heavily but has not had any alcoholic beverages for the past 1 week.  Denies regular NSAID use.  No longer vomiting now and has been drinking 4-5 bottles of water daily but not urinating as much and urine has been dark in color.  Denies fevers, cough, shortness of breath, chest pain, or any URI symptoms.  He stopped taking valsartan a year ago.  ED Course: Vital signs on arrival: Temperature 98 F, pulse 65, respiratory rate 16, blood pressure 112/75, and SpO2 98% on room air.  Labs showing no leukocytosis, sodium 126, chloride 85, BUN 129, creatinine 10.4, GFR 5, bicarb 24, potassium 3.8, UA pending, urine sodium and creatinine pending, blood cultures collected, lipase 148.  CT renal stone study showing acute uncomplicated diverticulitis of the mid to distal descending colon with no peridiverticular abscess or pneumoperitoneum.  Showing multiple small nonobstructive calyceal calculi bilaterally without hydronephrosis.  A few dilated segments of the proximal small bowel measuring up to 4 cm, without abrupt transition point,  probably reflecting a focal ileus.  No acute pancreatic abnormality seen on CT.  Ceftriaxone , metronidazole , and CIWA protocol ordered.  EDP discussed the case with nephrology and they recommended giving 2 L IV fluid boluses followed by continuous IV fluid infusion at 200 mL/hr. Nephrology stated that if the patient's renal function does not improve on repeat labs in the morning, then they can be formally consulted.  Review of Systems:  Review of Systems  All other systems reviewed and are negative.   Past Medical History:  Diagnosis Date   Anal fissure    Calcium deposits in tendon    KNEE   Elevated LFTs    Essential hypertension, benign    Frozen shoulder    Hemorrhoid    fistula   Migraine, unspecified, without mention of intractable migraine without mention of status migrainosus    [facility]   Mixed hyperlipidemia    Nontoxic uninodular goiter    Tubular adenoma of colon 07/2010   MULTIPLE    Past Surgical History:  Procedure Laterality Date   DIAGNOSTIC LAPAOSCOPY     hernia repair   HEMICOLECTOMY     with ileocolonic anastomosis     reports that he quit smoking about 15 years ago. His smoking use included cigarettes. He has never used smokeless tobacco. He reports current alcohol use of about 7.0 standard drinks of alcohol per week. He reports that he does not use drugs.  Allergies[1]  Family History  Problem Relation Age of Onset   Hypotension Mother    Hypertension Father  Hypertension Sister    Heart attack Maternal Grandmother    Heart attack Maternal Grandfather    Heart attack Paternal Grandmother    Heart attack Paternal Grandfather     Prior to Admission medications  Medication Sig Start Date End Date Taking? Authorizing Provider  amLODipine  (NORVASC ) 10 MG tablet Take 10 mg by mouth at bedtime.     [provider]  aspirin EC 81 MG tablet Take 162 mg by mouth daily.     [provider]  Carboxymethylcellulose Sodium  (LUBRICANT EYE DROPS OP) Place 1 drop into both eyes daily as needed (dry eyes).    [provider]  Fluocin-Hydroquinone-Tretinoin (TRI-LUMA ) 0.01-4-0.05 % CREA Apply to affected area nightly. 30 day supply 12/15/21   Tafeen, Stuart, MD  hydrALAZINE (APRESOLINE) 25 MG tablet Take 25 mg by mouth at bedtime.  04/21/15   [provider]  labetalol  (NORMODYNE ) 200 MG tablet Take 200 mg by mouth 2 (two) times daily.     [provider]  meloxicam  (MOBIC ) 15 MG tablet Take 1 tablet (15 mg total) by mouth daily. 12/09/21   Silva Juliene SAUNDERS, DPM  Multiple Vitamins tablet Take 1 tablet by mouth daily.     [provider]  valsartan (DIOVAN) 320 MG tablet Take 320 mg by mouth at bedtime.     [provider]  vardenafil (LEVITRA) 20 MG tablet Take 20 mg by mouth daily as needed for erectile dysfunction.    [provider]    Physical Exam: Vitals:   11/14/24 1721 11/14/24 1740 11/14/24 2026  BP: 112/75  96/61  Pulse: 65  63  Resp: 16  18  Temp:   98 F (36.7 C)  TempSrc:   Oral  SpO2: 98%  98%  Weight:  68 kg   Height:  5' 7 (1.702 m)     Physical Exam Vitals reviewed.  Constitutional:      General: He is not in acute distress. HENT:     Head: Normocephalic and atraumatic.  Eyes:     Extraocular Movements: Extraocular movements intact.  Cardiovascular:     Rate and Rhythm: Normal rate and regular rhythm.     Heart sounds: Normal heart sounds.  Pulmonary:     Effort: Pulmonary effort is normal. No respiratory distress.     Breath sounds: Normal breath sounds.  Abdominal:     General: Bowel sounds are normal.     Palpations: Abdomen is soft.     Tenderness: There is no abdominal tenderness. There is no guarding.  Musculoskeletal:     Cervical back: Normal range of motion.     Right lower leg: No edema.     Left lower leg: No edema.  Skin:    General: Skin is warm and dry.  Neurological:     General: No focal deficit present.      Mental Status: He is alert and oriented to person, place, and time.     Labs on Admission: I have personally reviewed following labs and imaging studies  CBC: Recent Labs  Lab 11/14/24 1757  WBC 6.6  NEUTROABS 3.9  HGB 15.1  HCT 41.9  MCV 90.1  PLT 348   Basic Metabolic Panel: Recent Labs  Lab 11/14/24 1757  NA 126*  K 3.8  CL 85*  CO2 24  GLUCOSE 101*  BUN 129*  CREATININE 10.40*  CALCIUM 9.1   GFR: Estimated Creatinine Clearance: 6.4 mL/min (A) (by C-G formula based on SCr of 10.4  mg/dL (H)). Liver Function Tests: Recent Labs  Lab 11/14/24 1757  AST 18  ALT 22  ALKPHOS 81  BILITOT 0.5  PROT 7.5  ALBUMIN 4.0   Recent Labs  Lab 11/14/24 1757  LIPASE 148*   No results for input(s): AMMONIA in the last 168 hours. Coagulation Profile: No results for input(s): INR, PROTIME in the last 168 hours. Cardiac Enzymes: No results for input(s): CKTOTAL, CKMB, CKMBINDEX, TROPONINI in the last 168 hours. BNP (last 3 results) No results for input(s): PROBNP in the last 8760 hours. HbA1C: No results for input(s): HGBA1C in the last 72 hours. CBG: No results for input(s): GLUCAP in the last 168 hours. Lipid Profile: No results for input(s): CHOL, HDL, LDLCALC, TRIG, CHOLHDL, LDLDIRECT in the last 72 hours. Thyroid  Function Tests: No results for input(s): TSH, T4TOTAL, FREET4, T3FREE, THYROIDAB in the last 72 hours. Anemia Panel: No results for input(s): VITAMINB12, FOLATE, FERRITIN, TIBC, IRON, RETICCTPCT in the last 72 hours. Urine analysis: No results found for: COLORURINE, APPEARANCEUR, LABSPEC, PHURINE, GLUCOSEU, HGBUR, BILIRUBINUR, KETONESUR, PROTEINUR, UROBILINOGEN, NITRITE, LEUKOCYTESUR  Radiological Exams on Admission: CT Renal Stone Study Result Date: 11/14/2024 EXAM: CT ABDOMEN AND PELVIS WITHOUT CONTRAST 11/14/2024 06:04:24 PM TECHNIQUE: CT of the abdomen and pelvis was  performed without the administration of intravenous contrast. Multiplanar reformatted images are provided for review. Automated exposure control, iterative reconstruction, and/or weight-based adjustment of the mA/kV was utilized to reduce the radiation dose to as low as reasonably achievable. COMPARISON: 04/16/2024 CLINICAL HISTORY: Abdominal/flank pain, stone suspected. FINDINGS: LOWER CHEST: No acute abnormality. LIVER: The liver is unremarkable. GALLBLADDER AND BILE DUCTS: Gallbladder is unremarkable. No biliary ductal dilatation. SPLEEN: No acute abnormality. PANCREAS: No acute abnormality. ADRENAL GLANDS: No acute abnormality. KIDNEYS, URETERS AND BLADDER: Bilateral renal cysts are present, measuring up to 4 cm in the left lower pole. Per consensus, no follow-up is needed for simple Bosniak type 1 and 2 renal cysts, unless the patient has a malignancy history or risk factors. Multiple small nonobstructive calyceal calculi bilaterally. No hydronephrosis. No perinephric or periureteral stranding. The urinary bladder is decompressed. GI AND BOWEL: Stomach demonstrates no acute abnormality. Small duodenal diverticulum arising from the third portion of the duodenum. Postsurgical changes of a right hemicolectomy. A few dilated segments of proximal small bowel measuring up to 4 cm without abrupt transition point, possibly reflecting a focal ileus. There is inflammatory stranding about the mid descending colon involving a couple of diverticula. No peridiverticular abscess. There is no bowel obstruction. PERITONEUM AND RETROPERITONEUM: No ascites. No free air. No free pelvic fluid. VASCULATURE: Tortuous abdominal aorta with diffuse aortoiliac atherosclerosis. LYMPH NODES: No lymphadenopathy. REPRODUCTIVE ORGANS: Mild prostatomegaly. BONES AND SOFT TISSUES: Mild multilevel degenerative disc disease of the spine. Small fat containing right inguinal hernia. No acute osseous abnormality. No focal soft tissue abnormality.  IMPRESSION: 1. Acute, uncomplicated diverticulitis of the colon mid to distal descending colon. No peridiverticular abscess or pneumoperitoneum. 2. Multiple small nonobstructive calyceal calculi bilaterally, without hydronephrosis. 3. A few dilated segments of proximal small bowel measuring up to 4 cm, without abrupt transition point, probably reflecting a focal ileus. 4. Mild prostatomegaly. Electronically signed by: Rogelia Myers MD 11/14/2024 06:12 PM EST RP Workstation: [id number]    EKG: Independently reviewed.  Normal sinus rhythm, no STEMI.  Assessment and Plan  AKI on CKD stage IIIa ?Prerenal from dehydration versus ATN.  Patient states he stopped taking valsartan a year ago and denies regular NSAID use.  Does report recent heavy alcohol consumption and  vomiting a week ago resulting in poor p.o. intake.  Creatinine previously 1.4 with GFR 56 on labs done in October 2023.  Creatinine was 2.8 on recent labs done on 1/9 and on repeat labs done today it has gone up to 10.4 with BUN 129 and GFR of 5.  CT showing multiple small nonobstructive calyceal calculi bilaterally without hydronephrosis.  Nephrology recommended aggressive IV fluid hydration and repeating labs in the morning to check renal function.  Recommended formally consulting their service if renal function does not improve on repeat labs.  UA pending, urine sodium and creatinine pending.  Monitor urine output.  Avoid nephrotoxic agents.  Hyponatremia Likely due to poor p.o. intake.  Continue IV fluid hydration with normal saline and monitor renal function.  Check serum osmolarity.  Goal rate of correction 4-6 mEq in 24 hours.  Acute uncomplicated diverticulitis No fever, leukocytosis, or signs of sepsis.  Continue ceftriaxone  and metronidazole .  Follow-up blood cultures.  Possible focal ileus CT showing a few dilated segments of the proximal small bowel measuring up to 4 cm, without abrupt transition point, probably reflecting a  focal ileus.  Not actively vomiting.  Bowel rest for now and monitor closely.  Elevated lipase No abdominal pain or tenderness at this time.  No acute pancreatic abnormality seen on CT.  ?Acute alcoholic pancreatitis.  Continue IV fluid hydration and trend lipase.  Hypertension Most recent blood pressure 110/72.  Continue home meds after pharmacy med rec is done.  Alcohol use disorder No signs of withdrawal at this time.  Continue CIWA protocol.  DVT prophylaxis: SQ Heparin  Code Status: Full Code (discussed with the patient) Family Communication: Wife at bedside. Level of care: Progressive Care Unit Admission status: It is my clinical opinion that admission to INPATIENT is reasonable and necessary because of the expectation that this patient will require hospital care that crosses at least 2 midnights to treat this condition based on the medical complexity of the problems presented.  Given the aforementioned information, the predictability of an adverse outcome is felt to be significant.  Editha Ram MD [facility]  If 7PM-7AM, please contact night-coverage [email or web address]  11/14/2024, 10:27 PM       [1]  Allergies Allergen Reactions   Penicillin G Other (See Comments)    Seizures, convulsions   "

## 2024-11-15 DIAGNOSIS — E876 Hypokalemia: Secondary | ICD-10-CM | POA: Insufficient documentation

## 2024-11-15 DIAGNOSIS — K5792 Diverticulitis of intestine, part unspecified, without perforation or abscess without bleeding: Secondary | ICD-10-CM

## 2024-11-15 DIAGNOSIS — E871 Hypo-osmolality and hyponatremia: Secondary | ICD-10-CM

## 2024-11-15 DIAGNOSIS — K567 Ileus, unspecified: Secondary | ICD-10-CM

## 2024-11-15 DIAGNOSIS — F1090 Alcohol use, unspecified, uncomplicated: Secondary | ICD-10-CM | POA: Insufficient documentation

## 2024-11-15 LAB — BASIC METABOLIC PANEL WITH GFR
Anion gap: 15 (ref 5–15)
BUN: 119 mg/dL — ABNORMAL HIGH (ref 8–23)
CO2: 22 mmol/L (ref 22–32)
Calcium: 8.2 mg/dL — ABNORMAL LOW (ref 8.9–10.3)
Chloride: 93 mmol/L — ABNORMAL LOW (ref 98–111)
Creatinine, Ser: 7.93 mg/dL — ABNORMAL HIGH (ref 0.61–1.24)
GFR, Estimated: 7 mL/min — ABNORMAL LOW
Glucose, Bld: 83 mg/dL (ref 70–99)
Potassium: 2.6 mmol/L — CL (ref 3.5–5.1)
Sodium: 130 mmol/L — ABNORMAL LOW (ref 135–145)

## 2024-11-15 LAB — URINALYSIS, W/ REFLEX TO CULTURE (INFECTION SUSPECTED)
Bilirubin Urine: NEGATIVE
Glucose, UA: NEGATIVE mg/dL
Hgb urine dipstick: NEGATIVE
Ketones, ur: NEGATIVE mg/dL
Leukocytes,Ua: NEGATIVE
Nitrite: NEGATIVE
Protein, ur: NEGATIVE mg/dL
Specific Gravity, Urine: 1.01 (ref 1.005–1.030)
pH: 5 (ref 5.0–8.0)

## 2024-11-15 LAB — CREATININE, URINE, RANDOM: Creatinine, Urine: 193 mg/dL

## 2024-11-15 LAB — OSMOLALITY: Osmolality: 315 mosm/kg — ABNORMAL HIGH (ref 275–295)

## 2024-11-15 LAB — MAGNESIUM: Magnesium: 3.2 mg/dL — ABNORMAL HIGH (ref 1.7–2.4)

## 2024-11-15 LAB — HIV ANTIBODY (ROUTINE TESTING W REFLEX): HIV Screen 4th Generation wRfx: NONREACTIVE

## 2024-11-15 LAB — NA AND K (SODIUM & POTASSIUM), RAND UR
Potassium Urine: 18 mmol/L
Sodium, Ur: <30 mmol/L

## 2024-11-15 LAB — LIPASE, BLOOD: Lipase: 124 U/L — ABNORMAL HIGH (ref 11–51)

## 2024-11-15 MED ORDER — SODIUM CHLORIDE 0.9 % IV SOLN: INTRAVENOUS | Status: DC

## 2024-11-15 MED ORDER — ACETAMINOPHEN 650 MG RE SUPP: 650.0000 mg | Freq: Four times a day (QID) | RECTAL | Status: DC | PRN

## 2024-11-15 MED ORDER — POTASSIUM CHLORIDE 10 MEQ/100ML IV SOLN
10.0000 meq | INTRAVENOUS | Status: AC
  Administered 2024-11-15 (×2): 10 meq via INTRAVENOUS
  Filled 2024-11-15 (×2): qty 100

## 2024-11-15 MED ORDER — METRONIDAZOLE 500 MG/100ML IV SOLN
500.0000 mg | Freq: Two times a day (BID) | INTRAVENOUS | Status: DC
  Administered 2024-11-15 – 2024-11-17 (×5): 500 mg via INTRAVENOUS
  Filled 2024-11-15 (×5): qty 100

## 2024-11-15 MED ORDER — SODIUM CHLORIDE 0.9 % IV SOLN
2.0000 g | INTRAVENOUS | Status: DC
  Administered 2024-11-15 – 2024-11-16 (×2): 2 g via INTRAVENOUS
  Filled 2024-11-15 (×2): qty 20

## 2024-11-15 MED ORDER — ACETAMINOPHEN 325 MG PO TABS
650.0000 mg | ORAL_TABLET | Freq: Four times a day (QID) | ORAL | Status: DC | PRN
  Administered 2024-11-16: 650 mg via ORAL
  Filled 2024-11-15: qty 2

## 2024-11-15 MED ORDER — HEPARIN SODIUM (PORCINE) 5000 UNIT/ML IJ SOLN
5000.0000 [IU] | Freq: Three times a day (TID) | INTRAMUSCULAR | Status: DC
  Administered 2024-11-15 – 2024-11-17 (×7): 5000 [IU] via SUBCUTANEOUS
  Filled 2024-11-15 (×7): qty 1

## 2024-11-15 MED ORDER — POTASSIUM CHLORIDE CRYS ER 20 MEQ PO TBCR
40.0000 meq | EXTENDED_RELEASE_TABLET | Freq: Once | ORAL | Status: AC
  Administered 2024-11-15: 40 meq via ORAL
  Filled 2024-11-15: qty 2

## 2024-11-15 NOTE — Assessment & Plan Note (Signed)
-   Very mild and do not think his nausea/vomiting was due to severe ileus and likely from underlying diverticulitis.  He has an appetite and is hungry with no significant nausea when seen - Continue diet and monitor bowel function

## 2024-11-15 NOTE — Assessment & Plan Note (Signed)
-   Likely multifactorial of GI losses with nausea, vomiting, mild diarrhea in addition to remaining on home antihypertensive regimen notably olmesartan (also mildly hypotensive on admission) and intermittent use of Celebrex -Creatinine 10.4 on admission, BUN 129 - FeNa ~ 0.3%.  At risk for ATN with prolonged prerenal -Currently responding to fluids.  Creatinine down to 7.93 -Continue on fluids and repeat BMP in a.m.

## 2024-11-15 NOTE — Progress Notes (Addendum)
 " Progress Note    William Weaver   FMW:[id number] DOB: [birth year]  DOA: 11/14/2024     1 PCP: Valentin Skates, DO  Initial CC: Elevated creatinine  Hospital Course: William Weaver is a 69 yo male with PMH HTN, arthritis, HLD, migraines, hemicolectomy with ileocolonic anastomosis.  William Weaver was referred to the ER by PCP due to elevated creatinine. William Weaver had approximately nausea and vomiting intermittently for about a week and a lot of hiccups.  William Weaver was prescribed gabapentin and Reglan.  William Weaver endorsed most improvement with gabapentin. William Weaver has also remained on amlodipine, chlorthalidone, labetalol, olmesartan.  William Weaver takes Celebrex approximately twice a week for his pains. William Weaver also had some mild abdominal pain with associated diarrhea. William Weaver also endorsed ongoing alcohol use prior to admission.  William Weaver underwent workup with CT renal stone protocol.  This showed acute diverticulitis, uncomplicated involving the distal descending colon.  Multiple nonobstructive calyceal calculi bilaterally without hydronephrosis.  And a few dilated segments of proximal small bowel measuring up to 4 cm with no transition point. William Weaver was started on antibiotics for diverticulitis.  Creatinine on admission was 10.4, BUN 129.  William Weaver was started on fluids.  Interval History:  Seen this morning in the ER.  Minimal abdominal pain and denies any nausea and has had no vomiting. Renal function improved some this morning.  Reviewed workup thus far with patient.  William Weaver is amenable and understanding with remaining in the hospital for ongoing IV fluids and treatment of diverticulitis.  Assessment and Plan: * AKI (acute kidney injury) - Likely multifactorial of GI losses with nausea, vomiting, mild diarrhea in addition to remaining on home antihypertensive regimen notably olmesartan (also mildly hypotensive on admission) and intermittent use of Celebrex -Creatinine 10.4 on admission, BUN 129 - FeNa ~ 0.3%.  At risk for ATN with prolonged prerenal -Currently  responding to fluids.  Creatinine down to 7.93 -Continue on fluids and repeat BMP in a.m.  Diverticulitis - Patient had been complaining of nonspecific abdominal pain with some nausea, vomiting, diarrhea prior to admission.  CT shows acute uncomplicated diverticulitis of the descending distal colon. -William Weaver does have an appetite.  Will start full liquids today - Continue Rocephin  and Flagyl  - Continue pain and nausea control  Ileus (HCC) - Very mild and do not think his nausea/vomiting was due to severe ileus and likely from underlying diverticulitis.  William Weaver has an appetite and is hungry with no significant nausea when seen - Continue diet and monitor bowel function  Alcohol use - Ongoing alcohol use prior to admission - There was concern that William Weaver may have withdrawal and William Weaver was started on CIWA protocol in case  Hypokalemia Replete as needed  Hyponatremia - Suspected from hypovolemic hyponatremia - Continue fluids and trend BMP  Essential hypertension, benign - BP P soft on admission.  Holding home meds for now    Antimicrobials: Rocephin  11/14/2024 >> current Flagyl  11/15/2024 >> current  Consultants:    Procedures:    DVT prophylaxis:  heparin  injection 5,000 Units Start: 11/15/24 0600   Code Status:   Code Status: Full Code  Barriers to discharge: None Therapy evaluation: PT Orders:   PT Follow up Rec:   Disposition Plan: Home Status is: Inpatient  Mobility Assessment (Last 72 Hours)     Mobility Assessment     Row Name 11/15/24 04:53:08           Does the patient have exclusion criteria? No- Perform mobility assessment       What is  the highest level of mobility based on the mobility assessment? Level 5 (Ambulates independently) - Balance while walking independently - Complete          Diet: Diet Orders (From admission, onward)     Start     Ordered   11/15/24 0915  Diet full liquid Room service appropriate? Yes; Fluid consistency: Thin  Diet effective  now       Question Answer Comment  Room service appropriate? Yes   Fluid consistency: Thin      11/15/24 0914            Objective: Blood pressure 128/73, pulse 72, temperature 98.1 F (36.7 C), temperature source Oral, resp. rate (!) 21, height 5' 7 (1.702 m), weight 68 kg, SpO2 100%.  Examination:  Physical Exam Constitutional:      General: William Weaver is not in acute distress.    Appearance: Normal appearance.  HENT:     Head: Normocephalic and atraumatic.     Mouth/Throat:     Mouth: Mucous membranes are moist.  Eyes:     Extraocular Movements: Extraocular movements intact.  Cardiovascular:     Rate and Rhythm: Normal rate and regular rhythm.  Pulmonary:     Effort: Pulmonary effort is normal. No respiratory distress.     Breath sounds: Normal breath sounds. No wheezing.  Abdominal:     General: Bowel sounds are normal. There is no distension.     Palpations: Abdomen is soft.     Tenderness: There is generalized abdominal tenderness.  Musculoskeletal:        General: Normal range of motion.     Cervical back: Normal range of motion and neck supple.  Skin:    General: Skin is warm and dry.  Neurological:     General: No focal deficit present.     Mental Status: William Weaver is alert.  Psychiatric:        Mood and Affect: Mood normal.        Behavior: Behavior normal.      Data Reviewed: Results for orders placed or performed during the hospital encounter of 11/14/24 (from the past 24 hours)  Comprehensive metabolic panel     Status: Abnormal   Collection Time: 11/14/24  5:57 PM  Result Value Ref Range   Sodium 126 (L) 135 - 145 mmol/L   Potassium 3.8 3.5 - 5.1 mmol/L   Chloride 85 (L) 98 - 111 mmol/L   CO2 24 22 - 32 mmol/L   Glucose, Bld 101 (H) 70 - 99 mg/dL   BUN 870 (H) 8 - 23 mg/dL   Creatinine, Ser 89.59 (H) 0.61 - 1.24 mg/dL   Calcium 9.1 8.9 - 89.6 mg/dL   Total Protein 7.5 6.5 - 8.1 g/dL   Albumin 4.0 3.5 - 5.0 g/dL   AST 18 15 - 41 U/L   ALT 22 0 - 44 U/L    Alkaline Phosphatase 81 38 - 126 U/L   Total Bilirubin 0.5 0.0 - 1.2 mg/dL   GFR, Estimated 5 (L) >60 mL/min   Anion gap 18 (H) 5 - 15  CBC with Differential     Status: Abnormal   Collection Time: 11/14/24  5:57 PM  Result Value Ref Range   WBC 6.6 4.0 - 10.5 K/uL   RBC 4.65 4.22 - 5.81 MIL/uL   Hemoglobin 15.1 13.0 - 17.0 g/dL   HCT 58.0 60.9 - 47.9 %   MCV 90.1 80.0 - 100.0 fL   MCH 32.5 26.0 -  34.0 pg   MCHC 36.0 30.0 - 36.0 g/dL   RDW 87.8 88.4 - 84.4 %   Platelets 348 150 - 400 K/uL   nRBC 0.0 0.0 - 0.2 %   Neutrophils Relative % 59 %   Neutro Abs 3.9 1.7 - 7.7 K/uL   Lymphocytes Relative 18 %   Lymphs Abs 1.2 0.7 - 4.0 K/uL   Monocytes Relative 19 %   Monocytes Absolute 1.2 (H) 0.1 - 1.0 K/uL   Eosinophils Relative 1 %   Eosinophils Absolute 0.1 0.0 - 0.5 K/uL   Basophils Relative 1 %   Basophils Absolute 0.0 0.0 - 0.1 K/uL   Immature Granulocytes 2 %   Abs Immature Granulocytes 0.13 (H) 0.00 - 0.07 K/uL  Lipase, blood     Status: Abnormal   Collection Time: 11/14/24  5:57 PM  Result Value Ref Range   Lipase 148 (H) 11 - 51 U/L  Blood culture (routine x 2)     Status: None (Preliminary result)   Collection Time: 11/14/24 10:30 PM   Specimen: BLOOD  Result Value Ref Range   Specimen Description BLOOD SITE NOT SPECIFIED    Special Requests      BOTTLES DRAWN AEROBIC AND ANAEROBIC Blood Culture adequate volume   Culture      NO GROWTH < 12 HOURS Performed at [facility], [address], [place], [place] [address]    Report Status PENDING   Blood culture (routine x 2)     Status: None (Preliminary result)   Collection Time: 11/14/24 10:35 PM   Specimen: BLOOD  Result Value Ref Range   Specimen Description BLOOD SITE NOT SPECIFIED    Special Requests      BOTTLES DRAWN AEROBIC AND ANAEROBIC Blood Culture adequate volume   Culture      NO GROWTH < 12 HOURS Performed at [facility], [address] [place], [place] [address]    Report Status  PENDING   HIV Antibody (routine testing w rflx)     Status: None   Collection Time: 11/15/24 12:01 AM  Result Value Ref Range   HIV Screen 4th Generation wRfx Non Reactive Non Reactive  Urinalysis, w/ Reflex to Culture (Infection Suspected) -Urine, Clean Catch     Status: Abnormal   Collection Time: 11/15/24  2:37 AM  Result Value Ref Range   Specimen Source URINE, CLEAN CATCH    Color, Urine YELLOW YELLOW   APPearance HAZY (A) CLEAR   Specific Gravity, Urine 1.010 1.005 - 1.030   pH 5.0 5.0 - 8.0   Glucose, UA NEGATIVE NEGATIVE mg/dL   Hgb urine dipstick NEGATIVE NEGATIVE   Bilirubin Urine NEGATIVE NEGATIVE   Ketones, ur NEGATIVE NEGATIVE mg/dL   Protein, ur NEGATIVE NEGATIVE mg/dL   Nitrite NEGATIVE NEGATIVE   Leukocytes,Ua NEGATIVE NEGATIVE   RBC / HPF 0-5 0 - 5 RBC/hpf   WBC, UA 0-5 0 - 5 WBC/hpf   Bacteria, UA RARE (A) NONE SEEN   Squamous Epithelial / HPF 0-5 0 - 5 /HPF   Mucus PRESENT    Hyaline Casts, UA PRESENT   Na and K (sodium & potassium), rand urine     Status: None   Collection Time: 11/15/24  2:37 AM  Result Value Ref Range   Sodium, Ur <30 mmol/L   Potassium Urine 18 mmol/L  Creatinine, urine, random     Status: None   Collection Time: 11/15/24  2:37 AM  Result Value Ref Range  Creatinine, Urine 193 mg/dL  Basic metabolic panel     Status: Abnormal   Collection Time: 11/15/24  5:00 AM  Result Value Ref Range   Sodium 130 (L) 135 - 145 mmol/L   Potassium 2.6 (LL) 3.5 - 5.1 mmol/L   Chloride 93 (L) 98 - 111 mmol/L   CO2 22 22 - 32 mmol/L   Glucose, Bld 83 70 - 99 mg/dL   BUN 880 (H) 8 - 23 mg/dL   Creatinine, Ser 2.06 (H) 0.61 - 1.24 mg/dL   Calcium 8.2 (L) 8.9 - 10.3 mg/dL   GFR, Estimated 7 (L) >60 mL/min   Anion gap 15 5 - 15  Lipase, blood     Status: Abnormal   Collection Time: 11/15/24  5:00 AM  Result Value Ref Range   Lipase 124 (H) 11 - 51 U/L  Magnesium     Status: Abnormal   Collection Time: 11/15/24  5:00 AM  Result Value Ref Range    Magnesium 3.2 (H) 1.7 - 2.4 mg/dL  Osmolality     Status: Abnormal   Collection Time: 11/15/24  6:22 AM  Result Value Ref Range   Osmolality 315 (H) 275 - 295 mOsm/kg    I have reviewed pertinent nursing notes, vitals, labs, and images as necessary. I have ordered labwork to follow up on as indicated.  I have reviewed the last notes from staff over past 24 hours. I have discussed patient's care plan and test results with nursing staff, CM/SW, and other staff as appropriate.  Old records reviewed in assessment of this patient  Time spent: Greater than 50% of the 55 minute visit was spent in counseling/coordination of care for the patient as laid out in the A&P.   LOS: 1 day   Alm Apo, MD [facility] 11/15/2024, 11:23 AM "

## 2024-11-15 NOTE — Assessment & Plan Note (Signed)
-   Patient had been complaining of nonspecific abdominal pain with some nausea, vomiting, diarrhea prior to admission.  CT shows acute uncomplicated diverticulitis of the descending distal colon. -He does have an appetite.  Will start full liquids today - Continue Rocephin  and Flagyl  - Continue pain and nausea control

## 2024-11-15 NOTE — Assessment & Plan Note (Signed)
 Replete as needed

## 2024-11-15 NOTE — Plan of Care (Signed)

## 2024-11-15 NOTE — ED Notes (Signed)
 This RN called nutritional  services to obtain tray for patient.

## 2024-11-15 NOTE — Assessment & Plan Note (Signed)
-   Ongoing alcohol use prior to admission - There was concern that he may have withdrawal and he was started on CIWA protocol in case

## 2024-11-15 NOTE — Hospital Course (Signed)
 William Weaver is a 69 yo male with PMH HTN, arthritis, HLD, migraines, hemicolectomy with ileocolonic anastomosis.  He was referred to the ER by PCP due to elevated creatinine. He had approximately nausea and vomiting intermittently for about a week and a lot of hiccups.  He was prescribed gabapentin and Reglan.  He endorsed most improvement with gabapentin. He has also remained on amlodipine , chlorthalidone, labetalol , olmesartan.  He takes Celebrex approximately twice a week for his pains. He also had some mild abdominal pain with associated diarrhea. He also endorsed ongoing alcohol use prior to admission.  He underwent workup with CT renal stone protocol.  This showed acute diverticulitis, uncomplicated involving the distal descending colon.  Multiple nonobstructive calyceal calculi bilaterally without hydronephrosis.  And a few dilated segments of proximal small bowel measuring up to 4 cm with no transition point. He was started on antibiotics for diverticulitis.  Creatinine on admission was 10.4, BUN 129.  He was started on fluids.

## 2024-11-15 NOTE — Assessment & Plan Note (Signed)
-   BP P soft on admission.  Holding home meds for now

## 2024-11-15 NOTE — Assessment & Plan Note (Signed)
-   Suspected from hypovolemic hyponatremia - Continue fluids and trend BMP

## 2024-11-16 DIAGNOSIS — K567 Ileus, unspecified: Secondary | ICD-10-CM

## 2024-11-16 DIAGNOSIS — E876 Hypokalemia: Secondary | ICD-10-CM | POA: Diagnosis not present

## 2024-11-16 DIAGNOSIS — F1090 Alcohol use, unspecified, uncomplicated: Secondary | ICD-10-CM

## 2024-11-16 DIAGNOSIS — N179 Acute kidney failure, unspecified: Secondary | ICD-10-CM | POA: Diagnosis not present

## 2024-11-16 DIAGNOSIS — K5792 Diverticulitis of intestine, part unspecified, without perforation or abscess without bleeding: Secondary | ICD-10-CM | POA: Diagnosis not present

## 2024-11-16 LAB — BASIC METABOLIC PANEL WITH GFR
Anion gap: 13 (ref 5–15)
BUN: 79 mg/dL — ABNORMAL HIGH (ref 8–23)
CO2: 21 mmol/L — ABNORMAL LOW (ref 22–32)
Calcium: 9 mg/dL (ref 8.9–10.3)
Chloride: 105 mmol/L (ref 98–111)
Creatinine, Ser: 2.9 mg/dL — ABNORMAL HIGH (ref 0.61–1.24)
GFR, Estimated: 23 mL/min — ABNORMAL LOW
Glucose, Bld: 85 mg/dL (ref 70–99)
Potassium: 3.4 mmol/L — ABNORMAL LOW (ref 3.5–5.1)
Sodium: 139 mmol/L (ref 135–145)

## 2024-11-16 LAB — CBC WITH DIFFERENTIAL/PLATELET
Abs Immature Granulocytes: 0.12 K/uL — ABNORMAL HIGH (ref 0.00–0.07)
Basophils Absolute: 0 K/uL (ref 0.0–0.1)
Basophils Relative: 0 %
Eosinophils Absolute: 0.1 K/uL (ref 0.0–0.5)
Eosinophils Relative: 1 %
HCT: 42.1 % (ref 39.0–52.0)
Hemoglobin: 14.8 g/dL (ref 13.0–17.0)
Immature Granulocytes: 2 %
Lymphocytes Relative: 20 %
Lymphs Abs: 1.1 K/uL (ref 0.7–4.0)
MCH: 32 pg (ref 26.0–34.0)
MCHC: 35.2 g/dL (ref 30.0–36.0)
MCV: 91.1 fL (ref 80.0–100.0)
Monocytes Absolute: 1 K/uL (ref 0.1–1.0)
Monocytes Relative: 17 %
Neutro Abs: 3.4 K/uL (ref 1.7–7.7)
Neutrophils Relative %: 60 %
Platelets: 303 K/uL (ref 150–400)
RBC: 4.62 MIL/uL (ref 4.22–5.81)
RDW: 12.4 % (ref 11.5–15.5)
WBC: 5.7 K/uL (ref 4.0–10.5)
nRBC: 0 % (ref 0.0–0.2)

## 2024-11-16 LAB — MAGNESIUM: Magnesium: 2.9 mg/dL — ABNORMAL HIGH (ref 1.7–2.4)

## 2024-11-16 MED ORDER — POTASSIUM CHLORIDE CRYS ER 20 MEQ PO TBCR
40.0000 meq | EXTENDED_RELEASE_TABLET | Freq: Once | ORAL | Status: AC
  Administered 2024-11-16: 40 meq via ORAL
  Filled 2024-11-16: qty 2

## 2024-11-16 MED ORDER — LOPERAMIDE HCL 2 MG PO CAPS
2.0000 mg | ORAL_CAPSULE | ORAL | Status: DC | PRN
  Administered 2024-11-16: 2 mg via ORAL
  Filled 2024-11-16: qty 1

## 2024-11-16 MED ORDER — METHYLPREDNISOLONE SODIUM SUCC 125 MG IJ SOLR
60.0000 mg | Freq: Once | INTRAMUSCULAR | Status: AC
  Administered 2024-11-16: 60 mg via INTRAVENOUS
  Filled 2024-11-16: qty 2

## 2024-11-16 NOTE — Plan of Care (Signed)

## 2024-11-16 NOTE — Progress Notes (Addendum)
 "                        PROGRESS NOTE        PATIENT DETAILS Name: William Weaver Age: 69 y.o. Sex: male Date of Birth: June 14, 1956 Admit Date: 11/14/2024 Admitting Physician Editha Ram, MD ERE:Dxjxoz, Massie, DO  Brief Summary: Patient is a 69 y.o.  male with a history of HTN, migraine headaches-who was sent from PCP for elevated creatinine.  However he  was having-nausea/vomiting for approximately 1 week prior to this hospitalization.  CT abdomen showed acute diverticulitis of the distal descending colon.  Significant events: 1/15>> admit to [facility].  Significant studies: 1/15>> CT renal stone study: Acute uncomplicated diverticulitis of the mid to distal descending colon.  Significant microbiology data: 1/15>> blood culture: No growth  Procedures: None  Consults: None  Subjective: Feels much better-no major issues overnight.  Objective: Vitals: Blood pressure 132/66, pulse 79, temperature 98.2 F (36.8 C), temperature source Oral, resp. rate 18, height 5' 7 (1.702 m), weight 68 kg, SpO2 96%.   Exam: Gen Exam:Alert awake-not in any distress HEENT:atraumatic, normocephalic Chest: B/L clear to auscultation anteriorly CVS:S1S2 regular Abdomen:soft non tender, non distended Extremities:no edema-left great toe tenderness Neurology: Non focal Skin: no rash  Pertinent Labs/Radiology:    Latest Ref Rng & Units 11/16/2024    5:27 AM 11/14/2024    5:57 PM 04/29/2015    4:47 PM  CBC  WBC 4.0 - 10.5 K/uL 5.7  6.6  4.6   Hemoglobin 13.0 - 17.0 g/dL 85.1  84.8  85.0   Hematocrit 39.0 - 52.0 % 42.1  41.9  43.8   Platelets 150 - 400 K/uL 303  348  193.0     Lab Results  Component Value Date   NA 139 11/16/2024   K 3.4 (L) 11/16/2024   CL 105 11/16/2024   CO2 21 (L) 11/16/2024      Assessment/Plan: AKI In the setting of nausea/vomiting and olmesartan use Creatinine rapidly improving with supportive care Note-his creatinine on 2.9 at his PCPs office was  2.9-not sure if he has developed some underlying CKD-Will follow trend to determine whether his symptoms had started then.  Acute distal descending colon diverticulitis Improved-no leukocytosis Abdominal exam benign Rocephin /Flagyl   Ileus Had multiple BMs overnight-clinically this has resolved  EtOH use Last drink approximately 7-8 days back No signs of withdrawal symptoms CIWA protocol  Hypokalemia Replete/recheck.  HTN BP stable All antihypertensives on hold Resume when able  Left great toe gouty flare IV Solu-Medrol  x 1 Reassess 1/18  Code status:   Code Status: Full Code   DVT Prophylaxis: heparin  injection 5,000 Units Start: 11/15/24 0600   Family Communication: None at bedside   Disposition Plan: Status is: Inpatient Remains inpatient appropriate because: Severity of illness   Planned Discharge Destination:Home   Diet: Diet Order             DIET SOFT Room service appropriate? Yes; Fluid consistency: Thin  Diet effective now                     Antimicrobial agents: Anti-infectives (From admission, onward)    Start     Dose/Rate Route Frequency Ordered Stop   11/15/24 2200  cefTRIAXone  (ROCEPHIN ) 2 g in sodium chloride  0.9 % 100 mL IVPB        2 g 200 mL/hr over 30 Minutes Intravenous Every 24 hours 11/15/24 0002     11/15/24 1000  metroNIDAZOLE  (FLAGYL ) IVPB 500 mg        500 mg 100 mL/hr over 60 Minutes Intravenous Every 12 hours 11/15/24 0002     11/14/24 2100  cefTRIAXone  (ROCEPHIN ) 2 g in sodium chloride  0.9 % 100 mL IVPB       Placed in And Linked Group   2 g 200 mL/hr over 30 Minutes Intravenous  Once 11/14/24 2057 11/14/24 2315   11/14/24 2100  metroNIDAZOLE  (FLAGYL ) IVPB 500 mg       Placed in And Linked Group   500 mg 100 mL/hr over 60 Minutes Intravenous  Once 11/14/24 2057 11/15/24 0130        MEDICATIONS: Scheduled Meds:  folic acid   1 mg Oral Daily   heparin   5,000 Units Subcutaneous Q8H   multivitamin with  minerals  1 tablet Oral Daily   thiamine   100 mg Oral Daily   Or   thiamine   100 mg Intravenous Daily   Continuous Infusions:  sodium chloride  150 mL/hr at 11/16/24 0137   cefTRIAXone  (ROCEPHIN )  IV 2 g (11/15/24 2118)   metronidazole  500 mg (11/16/24 0814)   PRN Meds:.acetaminophen  **OR** acetaminophen , LORazepam  **OR** LORazepam    I have personally reviewed following labs and imaging studies  LABORATORY DATA: CBC: Recent Labs  Lab 11/14/24 1757 11/16/24 0527  WBC 6.6 5.7  NEUTROABS 3.9 3.4  HGB 15.1 14.8  HCT 41.9 42.1  MCV 90.1 91.1  PLT 348 303    Basic Metabolic Panel: Recent Labs  Lab 11/14/24 1757 11/15/24 0500 11/16/24 0527  NA 126* 130* 139  K 3.8 2.6* 3.4*  CL 85* 93* 105  CO2 24 22 21*  GLUCOSE 101* 83 85  BUN 129* 119* 79*  CREATININE 10.40* 7.93* 2.90*  CALCIUM 9.1 8.2* 9.0  MG  --  3.2* 2.9*    GFR: Estimated Creatinine Clearance: 23.1 mL/min (A) (by C-G formula based on SCr of 2.9 mg/dL (H)).  Liver Function Tests: Recent Labs  Lab 11/14/24 1757  AST 18  ALT 22  ALKPHOS 81  BILITOT 0.5  PROT 7.5  ALBUMIN 4.0   Recent Labs  Lab 11/14/24 1757 11/15/24 0500  LIPASE 148* 124*   No results for input(s): AMMONIA in the last 168 hours.  Coagulation Profile: No results for input(s): INR, PROTIME in the last 168 hours.  Cardiac Enzymes: No results for input(s): CKTOTAL, CKMB, CKMBINDEX, TROPONINI in the last 168 hours.  BNP (last 3 results) No results for input(s): PROBNP in the last 8760 hours.  Lipid Profile: No results for input(s): CHOL, HDL, LDLCALC, TRIG, CHOLHDL, LDLDIRECT in the last 72 hours.  Thyroid  Function Tests: No results for input(s): TSH, T4TOTAL, FREET4, T3FREE, THYROIDAB in the last 72 hours.  Anemia Panel: No results for input(s): VITAMINB12, FOLATE, FERRITIN, TIBC, IRON, RETICCTPCT in the last 72 hours.  Urine analysis:    Component Value Date/Time    COLORURINE YELLOW 11/15/2024 0237   APPEARANCEUR HAZY (A) 11/15/2024 0237   LABSPEC 1.010 11/15/2024 0237   PHURINE 5.0 11/15/2024 0237   GLUCOSEU NEGATIVE 11/15/2024 0237   HGBUR NEGATIVE 11/15/2024 0237   BILIRUBINUR NEGATIVE 11/15/2024 0237   KETONESUR NEGATIVE 11/15/2024 0237   PROTEINUR NEGATIVE 11/15/2024 0237   NITRITE NEGATIVE 11/15/2024 0237   LEUKOCYTESUR NEGATIVE 11/15/2024 0237    Sepsis Labs: Lactic Acid, Venous No results found for: LATICACIDVEN  MICROBIOLOGY: Recent Results (from the past 240 hours)  Blood culture (routine x 2)     Status: None (Preliminary result)  Collection Time: 11/14/24 10:30 PM   Specimen: BLOOD  Result Value Ref Range Status   Specimen Description BLOOD SITE NOT SPECIFIED  Final   Special Requests   Final    BOTTLES DRAWN AEROBIC AND ANAEROBIC Blood Culture adequate volume   Culture   Final    NO GROWTH 2 DAYS Performed at [facility], [address], [place], [place] [address]    Report Status PENDING  Incomplete  Blood culture (routine x 2)     Status: None (Preliminary result)   Collection Time: 11/14/24 10:35 PM   Specimen: BLOOD  Result Value Ref Range Status   Specimen Description BLOOD SITE NOT SPECIFIED  Final   Special Requests   Final    BOTTLES DRAWN AEROBIC AND ANAEROBIC Blood Culture adequate volume   Culture   Final    NO GROWTH 2 DAYS Performed at [facility], [address], [place], [place] [address]    Report Status PENDING  Incomplete    RADIOLOGY STUDIES/RESULTS: CT Renal Stone Study Result Date: 11/14/2024 EXAM: CT ABDOMEN AND PELVIS WITHOUT CONTRAST 11/14/2024 06:04:24 PM TECHNIQUE: CT of the abdomen and pelvis was performed without the administration of intravenous contrast. Multiplanar reformatted images are provided for review. Automated exposure control, iterative reconstruction, and/or weight-based adjustment of the mA/kV was utilized to reduce the radiation dose to as low as reasonably  achievable. COMPARISON: 04/16/2024 CLINICAL HISTORY: Abdominal/flank pain, stone suspected. FINDINGS: LOWER CHEST: No acute abnormality. LIVER: The liver is unremarkable. GALLBLADDER AND BILE DUCTS: Gallbladder is unremarkable. No biliary ductal dilatation. SPLEEN: No acute abnormality. PANCREAS: No acute abnormality. ADRENAL GLANDS: No acute abnormality. KIDNEYS, URETERS AND BLADDER: Bilateral renal cysts are present, measuring up to 4 cm in the left lower pole. Per consensus, no follow-up is needed for simple Bosniak type 1 and 2 renal cysts, unless the patient has a malignancy history or risk factors. Multiple small nonobstructive calyceal calculi bilaterally. No hydronephrosis. No perinephric or periureteral stranding. The urinary bladder is decompressed. GI AND BOWEL: Stomach demonstrates no acute abnormality. Small duodenal diverticulum arising from the third portion of the duodenum. Postsurgical changes of a right hemicolectomy. A few dilated segments of proximal small bowel measuring up to 4 cm without abrupt transition point, possibly reflecting a focal ileus. There is inflammatory stranding about the mid descending colon involving a couple of diverticula. No peridiverticular abscess. There is no bowel obstruction. PERITONEUM AND RETROPERITONEUM: No ascites. No free air. No free pelvic fluid. VASCULATURE: Tortuous abdominal aorta with diffuse aortoiliac atherosclerosis. LYMPH NODES: No lymphadenopathy. REPRODUCTIVE ORGANS: Mild prostatomegaly. BONES AND SOFT TISSUES: Mild multilevel degenerative disc disease of the spine. Small fat containing right inguinal hernia. No acute osseous abnormality. No focal soft tissue abnormality. IMPRESSION: 1. Acute, uncomplicated diverticulitis of the colon mid to distal descending colon. No peridiverticular abscess or pneumoperitoneum. 2. Multiple small nonobstructive calyceal calculi bilaterally, without hydronephrosis. 3. A few dilated segments of proximal small bowel  measuring up to 4 cm, without abrupt transition point, probably reflecting a focal ileus. 4. Mild prostatomegaly. Electronically signed by: Rogelia Myers MD 11/14/2024 06:12 PM EST RP Workstation: [id number]     LOS: 2 days   Donalda Applebaum, MD  [facility]    To contact the attending provider between 7A-7P or the covering provider during after hours 7P-7A, please log into the web site [email or web address] and access using universal [place] password for that web site. If you do not have the password, please call the hospital operator.  11/16/2024, 9:07 AM    "

## 2024-11-17 ENCOUNTER — Other Ambulatory Visit (HOSPITAL_COMMUNITY): Payer: Self-pay

## 2024-11-17 DIAGNOSIS — N179 Acute kidney failure, unspecified: Secondary | ICD-10-CM | POA: Diagnosis not present

## 2024-11-17 DIAGNOSIS — K5792 Diverticulitis of intestine, part unspecified, without perforation or abscess without bleeding: Secondary | ICD-10-CM | POA: Diagnosis not present

## 2024-11-17 DIAGNOSIS — F1090 Alcohol use, unspecified, uncomplicated: Secondary | ICD-10-CM | POA: Diagnosis not present

## 2024-11-17 DIAGNOSIS — E876 Hypokalemia: Secondary | ICD-10-CM | POA: Diagnosis not present

## 2024-11-17 LAB — BASIC METABOLIC PANEL WITH GFR
Anion gap: 10 (ref 5–15)
BUN: 49 mg/dL — ABNORMAL HIGH (ref 8–23)
CO2: 22 mmol/L (ref 22–32)
Calcium: 9 mg/dL (ref 8.9–10.3)
Chloride: 108 mmol/L (ref 98–111)
Creatinine, Ser: 1.75 mg/dL — ABNORMAL HIGH (ref 0.61–1.24)
GFR, Estimated: 42 mL/min — ABNORMAL LOW
Glucose, Bld: 94 mg/dL (ref 70–99)
Potassium: 4.4 mmol/L (ref 3.5–5.1)
Sodium: 140 mmol/L (ref 135–145)

## 2024-11-17 LAB — CBC
HCT: 38.7 % — ABNORMAL LOW (ref 39.0–52.0)
Hemoglobin: 13.8 g/dL (ref 13.0–17.0)
MCH: 32.6 pg (ref 26.0–34.0)
MCHC: 35.7 g/dL (ref 30.0–36.0)
MCV: 91.5 fL (ref 80.0–100.0)
Platelets: 313 K/uL (ref 150–400)
RBC: 4.23 MIL/uL (ref 4.22–5.81)
RDW: 12.7 % (ref 11.5–15.5)
WBC: 7.7 K/uL (ref 4.0–10.5)
nRBC: 0 % (ref 0.0–0.2)

## 2024-11-17 MED ORDER — CIPROFLOXACIN HCL 500 MG PO TABS
500.0000 mg | ORAL_TABLET | Freq: Two times a day (BID) | ORAL | 0 refills | Status: AC
  Filled 2024-11-17: qty 8, 4d supply, fill #0

## 2024-11-17 MED ORDER — METRONIDAZOLE 500 MG PO TABS
500.0000 mg | ORAL_TABLET | Freq: Two times a day (BID) | ORAL | 0 refills | Status: AC
  Filled 2024-11-17: qty 8, 4d supply, fill #0

## 2024-11-17 MED ORDER — AMLODIPINE BESYLATE 5 MG PO TABS: 5.0000 mg | ORAL_TABLET | Freq: Every day | ORAL | Status: DC

## 2024-11-17 MED ORDER — PREDNISONE 20 MG PO TABS
40.0000 mg | ORAL_TABLET | Freq: Every day | ORAL | 0 refills | Status: AC
  Filled 2024-11-17: qty 4, 2d supply, fill #0

## 2024-11-17 MED ORDER — LABETALOL HCL 200 MG PO TABS
200.0000 mg | ORAL_TABLET | Freq: Two times a day (BID) | ORAL | Status: DC
  Administered 2024-11-17: 200 mg via ORAL
  Filled 2024-11-17: qty 1

## 2024-11-17 MED ORDER — PANTOPRAZOLE SODIUM 40 MG PO TBEC: 40.0000 mg | DELAYED_RELEASE_TABLET | Freq: Every day | ORAL | Status: DC | PRN

## 2024-11-17 NOTE — Plan of Care (Signed)

## 2024-11-17 NOTE — Discharge Summary (Addendum)
 "  PATIENT DETAILS Name: William Weaver Age: 69 y.o. Sex: male Date of Birth: 1956-04-29 MRN: [id number]. Admitting Physician: Editha Ram, MD ERE:Dxjxoz, Massie, DO  Admit Date: 11/14/2024 Discharge date: 11/17/2024  Recommendations for Outpatient Follow-up:  Follow up with PCP in 1-2 weeks Please obtain CMP/CBC in one week Hold olmesartan/chlorthalidone until renal function improves further-follow with PCP.  Admitted From:  Home  Disposition: Home   Discharge Condition: good  CODE STATUS:   Code Status: Full Code   Diet recommendation:  Diet Order             Diet - low sodium heart healthy           DIET SOFT Room service appropriate? Yes; Fluid consistency: Thin  Diet effective now                    Brief Summary: Patient is a 69 y.o.  male with a history of HTN, migraine headaches-who was sent from PCP for elevated creatinine.  However he  was having-nausea/vomiting for approximately 1 week prior to this hospitalization.  CT abdomen showed acute diverticulitis of the distal descending colon.   Significant events: 1/15>> admit to [facility].   Significant studies: 1/15>> CT renal stone study: Acute uncomplicated diverticulitis of the mid to distal descending colon.   Significant microbiology data: 1/15>> blood culture: No growth   Procedures: None   Consults: None  Brief Hospital Course: AKI In the setting of nausea/vomiting and olmesartan use Creatinine rapidly improving-Down to 1.75 (10.4 on initial presentation)  Managed with IV fluids-offending medications were held Per patient-his creatinine in 2025 at PCPs office was around 1.4  He is very anxious to go home today-he has no nausea vomiting-suspect he is medically stable at this point to be discharged-with close follow-up with PCP.  I have asked him to get a repeat chemistry panel within a week at PCPs office.    Acute distal descending colon diverticulitis Overall improved-benign abdominal  exam-tolerating diet Treated with Rocephin /Flagyl -Will switch to Cipro /Flagyl  on discharge.  Ileus Had multiple BMs overnight-clinically this has resolved   EtOH use Last drink approximately 7-8 days back No signs of withdrawal symptoms Counseled regarding importance of quitting.  Hypokalemia Repleted.   HTN BP stable but creeping up Will resume amlodipine  and labetalol  but continue to hold olmesartan and chlorthalidone given lingering AKI-follow-up with PCP and adjust accordingly.   Left great toe gouty flare Much better after a dose of Solu-Medrol  on 1/17 Will give him a few more days of prednisone  and have him follow-up with PCP.   Discharge Diagnoses:  Principal Problem:   AKI (acute kidney injury) Active Problems:   Diverticulitis   Ileus (HCC)   Essential hypertension, benign   Hyponatremia   Hypokalemia   Alcohol use   Discharge Instructions:  Activity:  As tolerated   Discharge Instructions     Call MD for:  difficulty breathing, headache or visual disturbances   Complete by: As directed    Call MD for:  extreme fatigue   Complete by: As directed    Call MD for:  persistant dizziness or light-headedness   Complete by: As directed    Diet - low sodium heart healthy   Complete by: As directed    Discharge instructions   Complete by: As directed    Follow with Primary MD  Valentin Massie, DO in 1-2 weeks  Please get a complete blood count and chemistry panel checked by your Primary MD at your  next visit, and again as instructed by your Primary MD.  Get Medicines reviewed and adjusted: Please take all your medications with you for your next visit with your Primary MD  Laboratory/radiological data: Please request your Primary MD to go over all hospital tests and procedure/radiological results at the follow up, please ask your Primary MD to get all Hospital records sent to his/her office.  In some cases, they will be blood work, cultures and biopsy  results pending at the time of your discharge. Please request that your primary care M.D. follows up on these results.  Also Note the following: If you experience worsening of your admission symptoms, develop shortness of breath, life threatening emergency, suicidal or homicidal thoughts you must seek medical attention immediately by calling 911 or calling your MD immediately  if symptoms less severe.  You must read complete instructions/literature along with all the possible adverse reactions/side effects for all the Medicines you take and that have been prescribed to you. Take any new Medicines after you have completely understood and accpet all the possible adverse reactions/side effects.   Do not drive when taking Pain medications or sleeping medications (Benzodaizepines)  Do not take more than prescribed Pain, Sleep and Anxiety Medications. It is not advisable to combine anxiety,sleep and pain medications without talking with your primary care practitioner  Special Instructions: If you have smoked or chewed Tobacco  in the last 2 yrs please stop smoking, stop any regular Alcohol  and or any Recreational drug use.  Wear Seat belts while driving.  Please note: You were cared for by a hospitalist during your hospital stay. Once you are discharged, your primary care physician will handle any further medical issues. Please note that NO REFILLS for any discharge medications will be authorized once you are discharged, as it is imperative that you return to your primary care physician (or establish a relationship with a primary care physician if you do not have one) for your post hospital discharge needs so that they can reassess your need for medications and monitor your lab values.   Increase activity slowly   Complete by: As directed       Allergies as of 11/17/2024       Reactions   Penicillin G Other (See Comments)   Seizures, convulsions        Medication List     STOP taking  these medications    celecoxib 200 MG capsule Commonly known as: CELEBREX   chlorthalidone 25 MG tablet Commonly known as: HYGROTON   olmesartan 40 MG tablet Commonly known as: BENICAR       TAKE these medications    amLODipine  10 MG tablet Commonly known as: NORVASC  Take 10 mg by mouth at bedtime.   ciprofloxacin  500 MG tablet Commonly known as: Cipro  Take 1 tablet (500 mg total) by mouth 2 (two) times daily for 4 days.   gabapentin 100 MG capsule Commonly known as: NEURONTIN Take 100 mg by mouth daily as needed (for hiccups).   labetalol  200 MG tablet Commonly known as: NORMODYNE  Take 400 mg by mouth 3 (three) times daily.   LUBRICANT EYE DROPS OP Place 1 drop into both eyes daily as needed (dry eyes).   metroNIDAZOLE  500 MG tablet Commonly known as: FLAGYL  Take 1 tablet (500 mg total) by mouth 2 (two) times daily for 4 days.   pantoprazole  40 MG tablet Commonly known as: PROTONIX  Take 40 mg by mouth daily as needed (for heartburn).   predniSONE  20  MG tablet Commonly known as: DELTASONE  Take 2 tablets (40 mg total) by mouth daily for 2 days   vardenafil 20 MG tablet Commonly known as: LEVITRA Take 20 mg by mouth daily as needed for erectile dysfunction.        Follow-up Information     Valentin Skates, DO. Schedule an appointment as soon as possible for a visit in 1 week(s).   Specialty: Internal Medicine Contact information: [address] [place] [address] [phone]                Allergies[1]   Other Procedures/Studies: CT Renal Stone Study Result Date: 11/14/2024 EXAM: CT ABDOMEN AND PELVIS WITHOUT CONTRAST 11/14/2024 06:04:24 PM TECHNIQUE: CT of the abdomen and pelvis was performed without the administration of intravenous contrast. Multiplanar reformatted images are provided for review. Automated exposure control, iterative reconstruction, and/or weight-based adjustment of the mA/kV was utilized to reduce the radiation dose to as  low as reasonably achievable. COMPARISON: 04/16/2024 CLINICAL HISTORY: Abdominal/flank pain, stone suspected. FINDINGS: LOWER CHEST: No acute abnormality. LIVER: The liver is unremarkable. GALLBLADDER AND BILE DUCTS: Gallbladder is unremarkable. No biliary ductal dilatation. SPLEEN: No acute abnormality. PANCREAS: No acute abnormality. ADRENAL GLANDS: No acute abnormality. KIDNEYS, URETERS AND BLADDER: Bilateral renal cysts are present, measuring up to 4 cm in the left lower pole. Per consensus, no follow-up is needed for simple Bosniak type 1 and 2 renal cysts, unless the patient has a malignancy history or risk factors. Multiple small nonobstructive calyceal calculi bilaterally. No hydronephrosis. No perinephric or periureteral stranding. The urinary bladder is decompressed. GI AND BOWEL: Stomach demonstrates no acute abnormality. Small duodenal diverticulum arising from the third portion of the duodenum. Postsurgical changes of a right hemicolectomy. A few dilated segments of proximal small bowel measuring up to 4 cm without abrupt transition point, possibly reflecting a focal ileus. There is inflammatory stranding about the mid descending colon involving a couple of diverticula. No peridiverticular abscess. There is no bowel obstruction. PERITONEUM AND RETROPERITONEUM: No ascites. No free air. No free pelvic fluid. VASCULATURE: Tortuous abdominal aorta with diffuse aortoiliac atherosclerosis. LYMPH NODES: No lymphadenopathy. REPRODUCTIVE ORGANS: Mild prostatomegaly. BONES AND SOFT TISSUES: Mild multilevel degenerative disc disease of the spine. Small fat containing right inguinal hernia. No acute osseous abnormality. No focal soft tissue abnormality. IMPRESSION: 1. Acute, uncomplicated diverticulitis of the colon mid to distal descending colon. No peridiverticular abscess or pneumoperitoneum. 2. Multiple small nonobstructive calyceal calculi bilaterally, without hydronephrosis. 3. A few dilated segments of  proximal small bowel measuring up to 4 cm, without abrupt transition point, probably reflecting a focal ileus. 4. Mild prostatomegaly. Electronically signed by: Rogelia Myers MD 11/14/2024 06:12 PM EST RP Workstation: [id number]     TODAY-DAY OF DISCHARGE:  Subjective:   Keven Requena today has no headache,no chest abdominal pain,no new weakness tingling or numbness, feels much better wants to go home today.   Objective:   Blood pressure (!) 169/92, pulse 66, temperature 98.6 F (37 C), temperature source Oral, resp. rate 18, height 5' 7 (1.702 m), weight 68 kg, SpO2 97%.  Intake/Output Summary (Last 24 hours) at 11/17/2024 1025 Last data filed at 11/17/2024 0552 Gross per 24 hour  Intake 4843.08 ml  Output 1200 ml  Net 3643.08 ml   Filed Weights   11/14/24 1740  Weight: 68 kg    Exam: Awake Alert, Oriented *3, No new F.N deficits, Normal affect [place].AT,PERRAL Supple Neck,No JVD, No cervical lymphadenopathy appriciated.  Symmetrical Chest wall movement, Good air movement  bilaterally, CTAB RRR,No Gallops,Rubs or new Murmurs, No Parasternal Heave +ve B.Sounds, Abd Soft, Non tender, No organomegaly appriciated, No rebound -guarding or rigidity. No Cyanosis, Clubbing or edema, No new Rash or bruise   PERTINENT RADIOLOGIC STUDIES: No results found.   PERTINENT LAB RESULTS: CBC: Recent Labs    11/16/24 0527 11/17/24 0523  WBC 5.7 7.7  HGB 14.8 13.8  HCT 42.1 38.7*  PLT 303 313   CMET CMP     Component Value Date/Time   NA 140 11/17/2024 0523   K 4.4 11/17/2024 0523   CL 108 11/17/2024 0523   CO2 22 11/17/2024 0523   GLUCOSE 94 11/17/2024 0523   BUN 49 (H) 11/17/2024 0523   CREATININE 1.75 (H) 11/17/2024 0523   CALCIUM 9.0 11/17/2024 0523   PROT 7.5 11/14/2024 1757   ALBUMIN 4.0 11/14/2024 1757   AST 18 11/14/2024 1757   ALT 22 11/14/2024 1757   ALKPHOS 81 11/14/2024 1757   BILITOT 0.5 11/14/2024 1757   GFRNONAA 42 (L) 11/17/2024 0523    GFR Estimated  Creatinine Clearance: 38.3 mL/min (A) (by C-G formula based on SCr of 1.75 mg/dL (H)). Recent Labs    11/14/24 1757 11/15/24 0500  LIPASE 148* 124*   No results for input(s): CKTOTAL, CKMB, CKMBINDEX, TROPONINI in the last 72 hours. Invalid input(s): POCBNP No results for input(s): DDIMER in the last 72 hours. No results for input(s): HGBA1C in the last 72 hours. No results for input(s): CHOL, HDL, LDLCALC, TRIG, CHOLHDL, LDLDIRECT in the last 72 hours. No results for input(s): TSH, T4TOTAL, T3FREE, THYROIDAB in the last 72 hours.  Invalid input(s): FREET3 No results for input(s): VITAMINB12, FOLATE, FERRITIN, TIBC, IRON, RETICCTPCT in the last 72 hours. Coags: No results for input(s): INR in the last 72 hours.  Invalid input(s): PT Microbiology: Recent Results (from the past 240 hours)  Blood culture (routine x 2)     Status: None (Preliminary result)   Collection Time: 11/14/24 10:30 PM   Specimen: BLOOD  Result Value Ref Range Status   Specimen Description BLOOD SITE NOT SPECIFIED  Final   Special Requests   Final    BOTTLES DRAWN AEROBIC AND ANAEROBIC Blood Culture adequate volume   Culture   Final    NO GROWTH 3 DAYS Performed at [facility], [address], [place], [place] [address]    Report Status PENDING  Incomplete  Blood culture (routine x 2)     Status: None (Preliminary result)   Collection Time: 11/14/24 10:35 PM   Specimen: BLOOD  Result Value Ref Range Status   Specimen Description BLOOD SITE NOT SPECIFIED  Final   Special Requests   Final    BOTTLES DRAWN AEROBIC AND ANAEROBIC Blood Culture adequate volume   Culture   Final    NO GROWTH 3 DAYS Performed at [facility], [address] [place], [place] [address]    Report Status PENDING  Incomplete    FURTHER DISCHARGE INSTRUCTIONS:  Get Medicines reviewed and adjusted: Please take all your medications with you for your next visit  with your Primary MD  Laboratory/radiological data: Please request your Primary MD to go over all hospital tests and procedure/radiological results at the follow up, please ask your Primary MD to get all Hospital records sent to his/her office.  In some cases, they will be blood work, cultures and biopsy results pending at the time of your discharge. Please request that your primary care M.D. goes through all  the records of your hospital data and follows up on these results.  Also Note the following: If you experience worsening of your admission symptoms, develop shortness of breath, life threatening emergency, suicidal or homicidal thoughts you must seek medical attention immediately by calling 911 or calling your MD immediately  if symptoms less severe.  You must read complete instructions/literature along with all the possible adverse reactions/side effects for all the Medicines you take and that have been prescribed to you. Take any new Medicines after you have completely understood and accpet all the possible adverse reactions/side effects.   Do not drive when taking Pain medications or sleeping medications (Benzodaizepines)  Do not take more than prescribed Pain, Sleep and Anxiety Medications. It is not advisable to combine anxiety,sleep and pain medications without talking with your primary care practitioner  Special Instructions: If you have smoked or chewed Tobacco  in the last 2 yrs please stop smoking, stop any regular Alcohol  and or any Recreational drug use.  Wear Seat belts while driving.  Please note: You were cared for by a hospitalist during your hospital stay. Once you are discharged, your primary care physician will handle any further medical issues. Please note that NO REFILLS for any discharge medications will be authorized once you are discharged, as it is imperative that you return to your primary care physician (or establish a relationship with a primary care physician  if you do not have one) for your post hospital discharge needs so that they can reassess your need for medications and monitor your lab values.  Total Time spent coordinating discharge including counseling, education and face to face time equals greater than 30 minutes.  Signed: Donalda Applebaum 11/17/2024 10:25 AM      [1]  Allergies Allergen Reactions   Penicillin G Other (See Comments)    Seizures, convulsions   "

## 2024-11-17 NOTE — Plan of Care (Signed)

## 2024-11-17 NOTE — Discharge Instructions (Signed)
 SABRA

## 2024-11-17 NOTE — Progress Notes (Signed)
 CCMD/Namasbi called once tele on monitor removed.  PIV removed.  TOC meds delivered at bedside.  Transferring to E.R entrance now.

## 2024-11-17 NOTE — Progress Notes (Signed)
 Discharge   Patient expressed verbal understanding of discharge POC.   Patient given time to ask any questions.  Additional education included in AVS.  Alert oriented in good spirits.   ONe PIV removed. Pressure dressings intact. Second PIV has IVPB Abx infusing at this time. Tele monitor remains on unit discharged.  All personal belongings at bedtime. TOC meds in process.  Will discharge once all barriers are cleared. Primary RN and discharge lounge notified through secure chat.

## 2024-11-18 NOTE — Progress Notes (Unsigned)
 " Cardiology Office Note:    Date:  11/18/2024   ID:  William Weaver, DOB Mar 04, 1956, MRN [id number]  PCP:  Valentin Skates, DO  Cardiologist:  None  Electrophysiologist:  None   Referring MD: Valentin Skates, DO   No chief complaint on file. ***  History of Present Illness:    William Weaver is a 69 y.o. male with a hx of resistant hypertension, hyperaldosteronism who is referred by Dr. Georgann for evaluation of hypertension.  Past Medical History:  Diagnosis Date   Anal fissure    Calcium deposits in tendon    KNEE   Elevated LFTs    Essential hypertension, benign    Frozen shoulder    Hemorrhoid    fistula   Migraine, unspecified, without mention of intractable migraine without mention of status migrainosus    [facility]   Mixed hyperlipidemia    Nontoxic uninodular goiter    Tubular adenoma of colon 07/2010   MULTIPLE    Past Surgical History:  Procedure Laterality Date   DIAGNOSTIC LAPAOSCOPY     hernia repair   HEMICOLECTOMY     with ileocolonic anastomosis    Current Medications: Active Medications[1]   Allergies:   Penicillin g   Social History   Socioeconomic History   Marital status: Divorced    Spouse name: Not on file   Number of children: 2   Years of education: Not on file   Highest education level: Not on file  Occupational History   Occupation: data processing  Tobacco Use   Smoking status: Former    Current packs/day: 0.00    Types: Cigarettes    Quit date: 10/31/2009    Years since quitting: 15.0   Smokeless tobacco: Never  Vaping Use   Vaping status: Never Used  Substance and Sexual Activity   Alcohol use: Yes    Alcohol/week: 7.0 standard drinks of alcohol    Types: 7 Standard drinks or equivalent per week    Comment: glass of wine a day   Drug use: Never   Sexual activity: Not on file  Other Topics Concern   Not on file  Social History Narrative   Not on file   Social Drivers of Health   Tobacco Use: Medium Risk  (11/14/2024)   Patient History    Smoking Tobacco Use: Former    Smokeless Tobacco Use: Never    Passive Exposure: Not on Actuary Strain: Not on file  Food Insecurity: No Food Insecurity (11/15/2024)   Epic    Worried About Programme Researcher, Broadcasting/film/video in the Last Year: Never true    Ran Out of Food in the Last Year: Never true  Transportation Needs: No Transportation Needs (11/15/2024)   Epic    Lack of Transportation (Medical): No    Lack of Transportation (Non-Medical): No  Physical Activity: Not on file  Stress: Not on file  Social Connections: Moderately Integrated (11/15/2024)   Social Connection and Isolation Panel    Frequency of Communication with Friends and Family: More than three times a week    Frequency of Social Gatherings with Friends and Family: More than three times a week    Attends Religious Services: More than 4 times per year    Active Member of [employer] or Organizations: Yes    Attends Banker Meetings: More than 4 times per year    Marital Status: Divorced  Depression (PHQ2-9): Not on file  Alcohol Screen: Not on file  Housing: Low Risk (11/15/2024)   Epic    Unable to Pay for Housing in the Last Year: No    Number of Times Moved in the Last Year: 0    Homeless in the Last Year: No  Utilities: Not At Risk (11/15/2024)   Epic    Threatened with loss of utilities: No  Health Literacy: Not on file     Family History: The patient's ***family history includes Heart attack in his maternal grandfather, maternal grandmother, paternal grandfather, and paternal grandmother; Hypertension in his father and sister; Hypotension in his mother.  ROS:   Please see the history of present illness.    *** All other systems reviewed and are negative.  EKGs/Labs/Other Studies Reviewed:    The following studies were reviewed today: ***  EKG:  EKG is *** ordered today.  The ekg ordered today demonstrates ***  Recent Labs: 11/14/2024: ALT 22 11/16/2024:  Magnesium 2.9 11/17/2024: BUN 49; Creatinine, Ser 1.75; Hemoglobin 13.8; Platelets 313; Potassium 4.4; Sodium 140  Recent Lipid Panel No results found for: CHOL, TRIG, HDL, CHOLHDL, VLDL, LDLCALC, LDLDIRECT  Physical Exam:    VS:  There were no vitals taken for this visit.    Wt Readings from Last 3 Encounters:  11/14/24 150 lb (68 kg)  05/24/15 228 lb (103.4 kg)  04/29/15 230 lb 4 oz (104.4 kg)     GEN: *** Well nourished, well developed in no acute distress HEENT: Normal NECK: No JVD; No carotid bruits LYMPHATICS: No lymphadenopathy CARDIAC: ***RRR, no murmurs, rubs, gallops RESPIRATORY:  Clear to auscultation without rales, wheezing or rhonchi  ABDOMEN: Soft, non-tender, non-distended MUSCULOSKELETAL:  No edema; No deformity  SKIN: Warm and dry NEUROLOGIC:  Alert and oriented x 3 PSYCHIATRIC:  Normal affect   ASSESSMENT:    No diagnosis found. PLAN:    Resistant hypertension: On labetalol  400 mg 3 times daily, chlorthalidone 25 mg daily, olmesartan 40 mg daily, amlodipine  10 mg daily - Warrants workup for secondary causes.  Reportedly had secondary hypertension workup in 2015 with negative VMA levels, renal arterial duplex not suggestive of stenosis, renal level was normal.  Has not had sleep study.  Reportedly was diagnosed with hyperaldosteronism with I do not see where this diagnosis was made***  Obesity:  RTC in***  Medication Adjustments/Labs and Tests Ordered: Current medicines are reviewed at length with the patient today.  Concerns regarding medicines are outlined above.  No orders of the defined types were placed in this encounter.  No orders of the defined types were placed in this encounter.   There are no Patient Instructions on file for this visit.   Signed, Lonni LITTIE Nanas, MD  11/18/2024 10:26 PM    [facility]     [1]  No outpatient medications have been marked as taking for the 11/21/24 encounter  (Appointment) with Nanas Lonni LITTIE, MD.   "

## 2024-11-19 LAB — CULTURE, BLOOD (ROUTINE X 2)
Culture: NO GROWTH
Culture: NO GROWTH
Special Requests: ADEQUATE
Special Requests: ADEQUATE

## 2024-11-21 ENCOUNTER — Ambulatory Visit: Admitting: Cardiology

## 2025-02-21 ENCOUNTER — Ambulatory Visit: Admitting: Cardiology
